# Patient Record
Sex: Male | Born: 1975 | Race: White | Hispanic: No | Marital: Married | Smoking: Current every day smoker
Health system: Southern US, Community
[De-identification: ages and names within clinical notes are randomized; demographics above are authoritative.]

## PROBLEM LIST (undated history)

## (undated) DIAGNOSIS — G43909 Migraine, unspecified, not intractable, without status migrainosus: Secondary | ICD-10-CM

## (undated) DIAGNOSIS — J302 Other seasonal allergic rhinitis: Secondary | ICD-10-CM

## (undated) DIAGNOSIS — K649 Unspecified hemorrhoids: Secondary | ICD-10-CM

## (undated) DIAGNOSIS — J9383 Other pneumothorax: Secondary | ICD-10-CM

## (undated) HISTORY — DX: Unspecified hemorrhoids: K64.9

## (undated) HISTORY — PX: WRIST FRACTURE SURGERY: SHX121

## (undated) HISTORY — PX: FRACTURE SURGERY: SHX138

---

## 2000-08-12 ENCOUNTER — Emergency Department (HOSPITAL_COMMUNITY): Admission: EM | Admit: 2000-08-12 | Discharge: 2000-08-12 | Payer: Self-pay

## 2006-12-25 ENCOUNTER — Encounter
Admission: RE | Admit: 2006-12-25 | Discharge: 2006-12-25 | Payer: Self-pay | Admitting: Physical Medicine and Rehabilitation

## 2015-07-01 ENCOUNTER — Ambulatory Visit (INDEPENDENT_AMBULATORY_CARE_PROVIDER_SITE_OTHER): Payer: No Typology Code available for payment source | Admitting: Family Medicine

## 2015-07-01 VITALS — BP 142/98 | HR 95 | Temp 98.3°F | Resp 20 | Ht 69.5 in | Wt 169.0 lb

## 2015-07-01 DIAGNOSIS — K648 Other hemorrhoids: Secondary | ICD-10-CM

## 2015-07-01 DIAGNOSIS — K644 Residual hemorrhoidal skin tags: Secondary | ICD-10-CM | POA: Diagnosis not present

## 2015-07-01 DIAGNOSIS — K625 Hemorrhage of anus and rectum: Secondary | ICD-10-CM

## 2015-07-01 LAB — HEMOCCULT GUIAC POC 1CARD (OFFICE): Fecal Occult Blood, POC: NEGATIVE

## 2015-07-01 MED ORDER — HYDROCORTISONE ACETATE 25 MG RE SUPP: RECTAL | Status: DC

## 2015-07-01 NOTE — Patient Instructions (Addendum)
IF you received an x-ray today, you will receive an invoice from [facility]. Please contact [facility] at [phone] with questions or concerns regarding your invoice.   IF you received labwork today, you will receive an invoice from [facility]. Please contact [facility] at [phone] with questions or concerns regarding your invoice.   Our billing staff will not be able to assist you with questions regarding bills from these companies.  You will be contacted with the lab results as soon as they are available. The fastest way to get your results is to activate your My Chart account. Instructions are located on the last page of this paperwork. If you have not heard from us regarding the results in 2 weeks, please contact this office.  About Hemorrhoids  Hemorrhoids are swollen veins in the lower rectum and anus.  Also called piles, hemorrhoids are a common problem.  Hemorrhoids may be internal (inside the rectum) or external (around the anus).  Internal Hemorrhoids  Internal hemorrhoids are often painless, but they rarely cause bleeding.  The internal veins may stretch and fall down (prolapse) through the anus to the outside of the body.  The veins may then become irritated and painful.  External Hemorrhoids  External hemorrhoids can be easily seen or felt around the anal opening.  They are under the skin around the anus.  When the swollen veins are scratched or broken by straining, rubbing or wiping they sometimes bleed.  How Hemorrhoids Occur  Veins in the rectum and around the anus tend to swell under pressure.  Hemorrhoids can result from increased pressure in the veins of your anus or rectum.  Some sources of pressure are:   Straining to have a bowel movement because of constipation  Waiting too long to have a bowel movement  Coughing and sneezing often  Sitting for extended periods of time, including on the  toilet  Diarrhea  Obesity  Trauma or injury to the anus  Some liver diseases  Stress  Family history of hemorrhoids  Pregnancy  Pregnant women should try to avoid becoming constipated, because they are more likely to have hemorrhoids during pregnancy.  In the last trimester of pregnancy, the enlarged uterus may press on blood vessels and causes hemorrhoids.  In addition, the strain of childbirth sometimes causes hemorrhoids after the birth.  Symptoms of Hemorrhoids  Some symptoms of hemorrhoids include:  Swelling and/or a tender lump around the anus  Itching, mild burning and bleeding around the anus  Painful bowel movements with or without constipation  Bright red blood covering the stool, on toilet paper or in the toilet bowel.   Symptoms usually go away within a few days.  Always talk to your doctor about any bleeding to make sure it is not from some other causes.  Diagnosing and Treating Hemorrhoids  Diagnosis is made by an examination by your healthcare provider.  Special test can be performed by your doctor.    Most cases of hemorrhoids can be treated with:  High-fiber diet: Eat more high-fiber foods, which help prevent constipation.  Ask for more detailed fiber information on types and sources of fiber from your healthcare provider.  Fluids: Drink plenty of water.  This helps soften bowel movements so they are easier to pass.  Sitz baths and cold packs: Sitting in lukewarm water two or three times a day for 15 minutes cleases the anal area and may relieve discomfort.  If the water is too hot, swelling around the  anus will get worse.  Placing a cloth-covered ice pack on the anus for ten minutes four times a day can also help reduce selling.  Gently pushing a prolapsed hemorrhoid back inside after the bath or ice pack can be helpful.  Medications: For mild discomfort, your healthcare provider may suggest over-the-counter pain medication or prescribe a cream or ointment  for topical use.  The cream may contain witch hazel, zinc oxide or petroleum jelly.  Medicated suppositories are also a treatment option.  Always consult your doctor before applying medications or creams.  Procedures and surgeries: There are also a number of procedures and surgeries to shrink or remove hemorrhoids in more serious cases.  Talk to your physician about these options.  You can often prevent hemorrhoids or keep them from becoming worse by maintaining a healthy lifestyle.  Eat a fiber-rich diet of fruits, vegetables and whole grains.  Also, drink plenty of water and exercise regularly.   2007, [employer] Doc.30  Hemorrhoids Hemorrhoids are swollen veins around the rectum or anus. There are two types of hemorrhoids:   Internal hemorrhoids. These occur in the veins just inside the rectum. They may poke through to the outside and become irritated and painful.  External hemorrhoids. These occur in the veins outside the anus and can be felt as a painful swelling or hard lump near the anus. CAUSES  Pregnancy.   Obesity.   Constipation or diarrhea.   Straining to have a bowel movement.   Sitting for long periods on the toilet.  Heavy lifting or other activity that caused you to strain.  Anal intercourse. SYMPTOMS   Pain.   Anal itching or irritation.   Rectal bleeding.   Fecal leakage.   Anal swelling.   One or more lumps around the anus.  DIAGNOSIS  Your caregiver may be able to diagnose hemorrhoids by visual examination. Other examinations or tests that may be performed include:   Examination of the rectal area with a gloved hand (digital rectal exam).   Examination of anal canal using a small tube (scope).   A blood test if you have lost a significant amount of blood.  A test to look inside the colon (sigmoidoscopy or colonoscopy). TREATMENT Most hemorrhoids can be treated at home. However, if symptoms do not seem to be getting  better or if you have a lot of rectal bleeding, your caregiver may perform a procedure to help make the hemorrhoids get smaller or remove them completely. Possible treatments include:   Placing a rubber band at the base of the hemorrhoid to cut off the circulation (rubber band ligation).   Injecting a chemical to shrink the hemorrhoid (sclerotherapy).   Using a tool to burn the hemorrhoid (infrared light therapy).   Surgically removing the hemorrhoid (hemorrhoidectomy).   Stapling the hemorrhoid to block blood flow to the tissue (hemorrhoid stapling).  HOME CARE INSTRUCTIONS   Eat foods with fiber, such as whole grains, beans, nuts, fruits, and vegetables. Ask your doctor about taking products with added fiber in them (fibersupplements).  Increase fluid intake. Drink enough water and fluids to keep your urine clear or pale yellow.   Exercise regularly.   Go to the bathroom when you have the urge to have a bowel movement. Do not wait.   Avoid straining to have bowel movements.   Keep the anal area dry and clean. Use wet toilet paper or moist towelettes after a bowel movement.   Medicated creams and suppositories may be used  or applied as directed.   Only take over-the-counter or prescription medicines as directed by your caregiver.   Take warm sitz baths for 15-20 minutes, 3-4 times a day to ease pain and discomfort.   Place ice packs on the hemorrhoids if they are tender and swollen. Using ice packs between sitz baths may be helpful.   Put ice in a plastic bag.   Place a towel between your skin and the bag.   Leave the ice on for 15-20 minutes, 3-4 times a day.   Do not use a donut-shaped pillow or sit on the toilet for long periods. This increases blood pooling and pain.  SEEK MEDICAL CARE IF:  You have increasing pain and swelling that is not controlled by treatment or medicine.  You have uncontrolled bleeding.  You have difficulty or you are unable  to have a bowel movement.  You have pain or inflammation outside the area of the hemorrhoids. MAKE SURE YOU:  Understand these instructions.  Will watch your condition.  Will get help right away if you are not doing well or get worse.   This information is not intended to replace advice given to you by your health care provider. Make sure you discuss any questions you have with your health care provider.   Document Released: 04/02/2000 Document Revised: 03/22/2012 Document Reviewed: 02/08/2012 Elsevier Interactive Patient Education [employer]

## 2015-07-01 NOTE — Progress Notes (Signed)
Subjective:    Patient ID: Riley Lewis, male    DOB: [birth year], 40 y.o.   MRN: [id number] By signing my name below, I, Littie Deeds, attest that this documentation has been prepared under the direction and in the presence of Norberto Sorenson, MD.  Electronically Signed: Littie Deeds, Medical Scribe. 07/01/2015. 7:08 PM.  Chief Complaint  Patient presents with  . Rectal Bleeding    Wants referral to GI    HPI HPI Comments: Riley Lewis is a 40 y.o. male who presents to the [facility] complaining of intermittent, worsening rectal bleeding with bright red blood that started 5 years ago. This is mostly noticed with bowel movements, but he has had 3 episodes of rectal bleeding not during bowel movements. The last episode was 2 days ago. These episodes resolve on their own and require him to change his underwear and shorts. The blood is noticed on the outside of the stool. He has tried Tucks pads. He does feel hemorrhoids when wiping. Patient denies constipation, diarrhea, dizziness, lightheadedness, and rectal pain or itching. His stool is contiguous. He has 1 bowel movement a day. Patient is requesting a referral to GI. He had tried to schedule an appointment with GI but was told he needed a referral.  History reviewed. No pertinent past medical history. Past Surgical History  Procedure Laterality Date  . Fracture surgery     No current outpatient prescriptions on file prior to visit.   No current facility-administered medications on file prior to visit.   Allergies  Allergen Reactions  . Oxycodone Itching   Family History  Problem Relation Age of Onset  . Hyperlipidemia Mother   . Hyperlipidemia Father   . Cancer Maternal Grandmother   . Cancer Maternal Grandfather   . Cancer Paternal Grandfather    Social History   Social History  . Marital Status: Single    Spouse Name: N/A  . Number of Children: N/A  . Years of Education: N/A   Social History Main Topics  .  Smoking status: Former Games developer  . Smokeless tobacco: Never Used  . Alcohol Use: 7.2 oz/week    12 Standard drinks or equivalent per week  . Drug Use: No  . Sexual Activity: Not Asked   Other Topics Concern  . None   Social History Narrative  . None     Review of Systems  Constitutional: Negative for fever, chills, diaphoresis, activity change and appetite change.  Gastrointestinal: Positive for blood in stool and anal bleeding. Negative for nausea, vomiting, abdominal pain, diarrhea, constipation, abdominal distention and rectal pain.  Genitourinary: Negative for dysuria, urgency, frequency, hematuria, flank pain, discharge, penile swelling, scrotal swelling, enuresis, difficulty urinating, genital sores, penile pain and testicular pain.  Skin: Negative for wound.  Neurological: Negative for dizziness and light-headedness.  Hematological: Negative for adenopathy.       Objective:  BP 142/98 mmHg  Pulse 95  Temp(Src) 98.3 F (36.8 C) (Oral)  Resp 20  Ht 5' 9.5" (1.765 m)  Wt 169 lb (76.658 kg)  BMI 24.61 kg/m2  SpO2 98%  Physical Exam  Constitutional: He is oriented to person, place, and time. He appears well-developed and well-nourished. No distress.  HENT:  Head: Normocephalic and atraumatic.  Mouth/Throat: Oropharynx is clear and moist. No oropharyngeal exudate.  Eyes: Pupils are equal, round, and reactive to light.  Neck: Neck supple.  Cardiovascular: Normal rate.   Pulmonary/Chest: Effort normal.  Genitourinary:  2 large external hemorrhoids, 1 on  right, 1 on left. The external hemorrhoid on the left has a large pedunculated skin tag attached to it. Rectal exam shows normal anal tone, no masses, but suspect 2 internal hemorrhoids. Small amount of stool seen in vault. No gross bleeding. No fissures seen. Rectoscope was not used.  Musculoskeletal: He exhibits no edema.  Neurological: He is alert and oriented to person, place, and time. No cranial nerve deficit.  Skin:  Skin is warm and dry. No rash noted.  Psychiatric: He has a normal mood and affect. His behavior is normal.  Nursing note and vitals reviewed.         Assessment & Plan:   1. Rectal bleeding   2. Hemorrhoidal skin tags   3. Internal hemorrhoids with complication     Orders Placed This Encounter  Procedures  . Ambulatory referral to Gastroenterology    Referral Priority:  Routine    Referral Type:  Consultation    Referral Reason:  Specialty Services Required    Number of Visits Requested:  1  . POCT occult blood stool    Meds ordered this encounter  Medications  . hydrocortisone (ANUSOL-HC) 25 MG suppository    Sig: Place 1 rectally after a bowel movement once to twice a day    Dispense:  12 suppository    Refill:  0    I personally performed the services described in this documentation, which was scribed in my presence. The recorded information has been reviewed and considered, and addended by me as needed.  Norberto SorensonEva Danita Proud, MD MPH

## 2015-07-02 ENCOUNTER — Telehealth: Payer: Self-pay

## 2015-07-02 MED ORDER — HYDROCORTISONE ACETATE 25 MG RE SUPP: RECTAL | Status: DC

## 2015-07-02 NOTE — Telephone Encounter (Signed)
Pt states that a rx was supposed to be called in for him after his visit yesterday but nothing is at the phamacy -pt does not remember the name of meds  Best number [phone]

## 2015-07-02 NOTE — Telephone Encounter (Signed)
I resent medication. Pt advised.

## 2015-07-03 ENCOUNTER — Encounter: Payer: Self-pay | Admitting: Internal Medicine

## 2015-08-07 ENCOUNTER — Encounter: Payer: Self-pay | Admitting: Internal Medicine

## 2015-08-07 ENCOUNTER — Ambulatory Visit (INDEPENDENT_AMBULATORY_CARE_PROVIDER_SITE_OTHER): Payer: PRIVATE HEALTH INSURANCE | Admitting: Internal Medicine

## 2015-08-07 VITALS — BP 132/90 | HR 88 | Ht 69.25 in | Wt 171.2 lb

## 2015-08-07 DIAGNOSIS — K625 Hemorrhage of anus and rectum: Secondary | ICD-10-CM

## 2015-08-07 MED ORDER — NA SULFATE-K SULFATE-MG SULF 17.5-3.13-1.6 GM/177ML PO SOLN: 1.0000 | Freq: Once | ORAL | Status: DC

## 2015-08-07 NOTE — Progress Notes (Signed)
HISTORY OF PRESENT ILLNESS:  Riley Lewis is a 40 y.o. 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 regarding rectal bleeding. The patient reports a several year history of intermittent minor rectal bleeding as manifested by pink or light red blood on the tissue. Last month on several occasions he noticed significant blood in the toilet bowl with defecation. Also some spontaneous blood per rectum. No associated rectal pain, abdominal pain, weight loss, or change in bowel habits. No family history of colon cancer. He was evaluated by [name] 07/01/2015. Rectal examination at that time revealed hemorrhoids. No other abnormalities. Hemoccult testing was negative. No CBC. He was prescribed hydrocortisone suppositories which she has been using fairly frequently. Less bleeding since  REVIEW OF SYSTEMS:  All non-GI ROS negative upon comprehensive review  Past Medical History  Diagnosis Date  . Hemorrhoids     Past Surgical History  Procedure Laterality Date  . Wrist surgery Left     fracture    Social History Riley Lewis  reports that he quit smoking about 3 years ago. His smoking use included Cigarettes. He has never used smokeless tobacco. He reports that he drinks about 7.2 oz of alcohol per week. He reports that he does not use illicit drugs.  family history includes Breast cancer in his maternal grandmother; Hyperlipidemia in his father and mother; Hypertension in his father and mother; Lung cancer in his paternal grandfather; Throat cancer in his maternal grandfather.  Allergies  Allergen Reactions  . Oxycodone Itching       PHYSICAL EXAMINATION: Vital signs: BP 132/90 mmHg  Pulse 88  Ht 5' 9.25" (1.759 m)  Wt 171 lb 4 oz (77.678 kg)  BMI 25.11 kg/m2  Constitutional: generally well-appearing, no acute distress Psychiatric: alert and oriented x3, cooperative Eyes: extraocular movements intact, anicteric, conjunctiva  pink Mouth: oral pharynx moist, no lesions Neck: supple no lymphadenopathy Cardiovascular: heart regular rate and rhythm, no murmur Lungs: clear to auscultation bilaterally Abdomen: soft, nontender, nondistended, no obvious ascites, no peritoneal signs, normal bowel sounds, no organomegaly Rectal:Per [name]. Deferred until colonoscopy Extremities: no clubbing cyanosis or lower extremity edema bilaterally Skin: no lesions on visible extremities Neuro: No focal deficits. Normal DTRs  ASSESSMENT:  #1. Rectal bleeding. Rule out significant colonic mucosal lesion   PLAN:  #1. Colonoscopy.The nature of the procedure, as well as the risks, benefits, and alternatives were carefully and thoroughly reviewed with the patient. Ample time for discussion and questions allowed. The patient understood, was satisfied, and agreed to proceed.  A copy of this consultation note has been sent to [name]

## 2015-08-07 NOTE — Patient Instructions (Signed)

## 2015-08-19 ENCOUNTER — Encounter: Payer: Self-pay | Admitting: Internal Medicine

## 2015-08-28 ENCOUNTER — Encounter: Payer: Self-pay | Admitting: Internal Medicine

## 2015-08-28 ENCOUNTER — Ambulatory Visit (AMBULATORY_SURGERY_CENTER): Payer: PRIVATE HEALTH INSURANCE | Admitting: Internal Medicine

## 2015-08-28 VITALS — BP 117/84 | HR 63 | Temp 98.4°F | Resp 10 | Ht 69.25 in | Wt 171.0 lb

## 2015-08-28 DIAGNOSIS — K625 Hemorrhage of anus and rectum: Secondary | ICD-10-CM

## 2015-08-28 MED ORDER — SODIUM CHLORIDE 0.9 % IV SOLN: 500.0000 mL | INTRAVENOUS | Status: DC

## 2015-08-28 NOTE — Op Note (Signed)
[facility] Patient Name: Riley Lewis Procedure Date: 08/28/2015 8:46 AM MRN: [id number] Endoscopist: Wilhemina Bonito. Marina Goodell , MD Age: 40 Date of Birth: 04-17-1976 Gender: Male Procedure:                Colonoscopy Indications:              Rectal bleeding Medicines:                Monitored Anesthesia Care Procedure:                Pre-Anesthesia Assessment:                           - Prior to the procedure, a History and Physical                            was performed, and patient medications and                            allergies were reviewed. The patient's tolerance of                            previous anesthesia was also reviewed. The risks                            and benefits of the procedure and the sedation                            options and risks were discussed with the patient.                            All questions were answered, and informed consent                            was obtained. Prior Anticoagulants: The patient has                            taken no previous anticoagulant or antiplatelet                            agents. ASA Grade Assessment: I - A normal, healthy                            patient. After reviewing the risks and benefits,                            the patient was deemed in satisfactory condition to                            undergo the procedure.                           After obtaining informed consent, the colonoscope                            was  passed under direct vision. Throughout the                            procedure, the patient's blood pressure, pulse, and                            oxygen saturations were monitored continuously. The                            Model CF-HQ190L [id number]) scope was introduced                            through the anus and advanced to the the cecum,                            identified by appendiceal orifice and ileocecal                            valve. The ileocecal  valve, appendiceal orifice,                            and rectum were photographed. The quality of the                            bowel preparation was excellent. The colonoscopy                            was performed without difficulty. The patient                            tolerated the procedure well. The bowel preparation                            used was SUPREP. Scope In: 8:51:43 AM Scope Out: 9:03:00 AM Scope Withdrawal Time: 0 hours 7 minutes 27 seconds  Total Procedure Duration: 0 hours 11 minutes 17 seconds  Findings:                 A few small-mouthed diverticula were found in the                            sigmoid colon.                           Internal hemorrhoids were found during                            retroflexion. The hemorrhoids were medium-sized.                            Tags present as well.                           The exam was otherwise without abnormality on  direct and retroflexion views. Complications:            No immediate complications. Estimated blood loss:                            None. Estimated Blood Loss:     Estimated blood loss: none. Impression:               - Diverticulosis in the sigmoid colon.                           - Internal hemorrhoids.                           - The examination was otherwise normal on direct                            and retroflexion views. Recommendation:           - Repeat colonoscopy in 10 years for screening                            purposes.                           - Continue hydrocortisone suppositories at night                            for 2 weeks                           - Daily fiber supplementation with Metamucil 2                            tablespoons                           - If bleeding refractory to these therapies,                            consider surgical hemorrhoidectomy. Wilhemina BonitoJohn N. Marina GoodellPerry, MD 08/28/2015 9:10:11 AM This report has been signed  electronically. CC Letter to:             Levell JulyEva N. Clelia CroftShaw

## 2015-08-28 NOTE — Progress Notes (Signed)
Report to PACU, RN, vss, BBS= Clear.  

## 2015-08-28 NOTE — Patient Instructions (Addendum)
YOU HAD AN ENDOSCOPIC PROCEDURE TODAY AT THE [facility]:   Refer to the procedure report that was given to you for any specific questions about what was found during the examination.  If the procedure report does not answer your questions, please call your gastroenterologist to clarify.  If you requested that your care partner not be given the details of your procedure findings, then the procedure report has been included in a sealed envelope for you to review at your convenience later.  YOU SHOULD EXPECT: Some feelings of bloating in the abdomen. Passage of more gas than usual.  Walking can help get rid of the air that was put into your GI tract during the procedure and reduce the bloating. If you had a lower endoscopy (such as a colonoscopy or flexible sigmoidoscopy) you may notice spotting of blood in your stool or on the toilet paper. If you underwent a bowel prep for your procedure, you may not have a normal bowel movement for a few days.  Please Note:  You might notice some irritation and congestion in your nose or some drainage.  This is from the oxygen used during your procedure.  There is no need for concern and it should clear up in a day or so.  SYMPTOMS TO REPORT IMMEDIATELY:   Following lower endoscopy (colonoscopy or flexible sigmoidoscopy):  Excessive amounts of blood in the stool  Significant tenderness or worsening of abdominal pains  Swelling of the abdomen that is new, acute  Fever of 100F or higher   For urgent or emergent issues, a gastroenterologist can be reached at any hour by calling [phone].   DIET: Your first meal following the procedure should be a small meal and then it is ok to progress to your normal diet. Heavy or fried foods are harder to digest and may make you feel nauseous or bloated.  Likewise, meals heavy in dairy and vegetables can increase bloating.  Drink plenty of fluids but you should avoid alcoholic beverages for 24 hours.  Try to  increase the fiber in your diet with Metamucil twice daily.   ACTIVITY:  You should plan to take it easy for the rest of today and you should NOT DRIVE or use heavy machinery until tomorrow (because of the sedation medicines used during the test).    FOLLOW UP: Our staff will call the number listed on your records the next business day following your procedure to check on you and address any questions or concerns that you may have regarding the information given to you following your procedure. If we do not reach you, we will leave a message.  However, if you are feeling well and you are not experiencing any problems, there is no need to return our call.  We will assume that you have returned to your regular daily activities without incident.   Continue your hydrocortisone suppositories as ordered.    SIGNATURES/CONFIDENTIALITY: You and/or your care partner have signed paperwork which will be entered into your electronic medical record.  These signatures attest to the fact that that the information above on your After Visit Summary has been reviewed and is understood.  Full responsibility of the confidentiality of this discharge information lies with you and/or your care-partner.  Thank-you for choosing us for your healthcare needs today.

## 2015-08-28 NOTE — Progress Notes (Addendum)
Patient up to bathroom at 20 min mark; no luck passing gas.  Patient is vague about any pain. Wife states that he would tell us if he were hurting.   Patient will walk the unit with wife until he passes some gas.  Patient walking hall and finally stated pain #5 abd so levsin was given.  Kept walking; refused a rectal tube.  Order obtained from Dr. Marina GoodellPerry for 30ml of simethicone po stat.  Simethicone given and patient is passing gas.  Patient still walking hall with #4 abd pain.    Discharged at 1004 with a pain of 3.5. Patient going home, but will call me with an update in 2 hours.  I suspect that he hurts more than he is willing to say, but he insists on going home. Dr. Marina GoodellPerry aware.

## 2015-08-29 ENCOUNTER — Telehealth: Payer: Self-pay

## 2015-08-29 NOTE — Telephone Encounter (Signed)
  Follow up Call-  Call back number 08/28/2015  Post procedure Call Back phone  # [phone]  Permission to leave phone message Yes     Patient questions:  Do you have a fever, pain , or abdominal swelling? No. Pain Score  0 *  Have you tolerated food without any problems? Yes.    Have you been able to return to your normal activities? Yes.    Do you have any questions about your discharge instructions: Diet   No. Medications  No. Follow up visit  No.  Do you have questions or concerns about your Care? No.  Actions: * If pain score is 4 or above: No action needed, pain <4.

## 2016-06-01 ENCOUNTER — Inpatient Hospital Stay (HOSPITAL_COMMUNITY): Payer: No Typology Code available for payment source

## 2016-06-01 ENCOUNTER — Emergency Department (HOSPITAL_COMMUNITY): Payer: No Typology Code available for payment source

## 2016-06-01 ENCOUNTER — Encounter (HOSPITAL_COMMUNITY): Payer: Self-pay | Admitting: Cardiothoracic Surgery

## 2016-06-01 ENCOUNTER — Inpatient Hospital Stay (HOSPITAL_COMMUNITY)
Admission: EM | Admit: 2016-06-01 | Discharge: 2016-06-10 | DRG: 165 | Disposition: A | Discharge status: Home / Self Care | Payer: No Typology Code available for payment source | Attending: Cardiothoracic Surgery | Admitting: Cardiothoracic Surgery

## 2016-06-01 DIAGNOSIS — J9383 Other pneumothorax: Secondary | ICD-10-CM | POA: Diagnosis present

## 2016-06-01 DIAGNOSIS — J9382 Other air leak: Secondary | ICD-10-CM | POA: Diagnosis not present

## 2016-06-01 DIAGNOSIS — F17290 Nicotine dependence, other tobacco product, uncomplicated: Secondary | ICD-10-CM | POA: Diagnosis present

## 2016-06-01 DIAGNOSIS — Z9689 Presence of other specified functional implants: Secondary | ICD-10-CM

## 2016-06-01 DIAGNOSIS — Z4682 Encounter for fitting and adjustment of non-vascular catheter: Secondary | ICD-10-CM

## 2016-06-01 DIAGNOSIS — R0602 Shortness of breath: Secondary | ICD-10-CM | POA: Diagnosis present

## 2016-06-01 DIAGNOSIS — Z09 Encounter for follow-up examination after completed treatment for conditions other than malignant neoplasm: Secondary | ICD-10-CM

## 2016-06-01 DIAGNOSIS — J939 Pneumothorax, unspecified: Secondary | ICD-10-CM | POA: Diagnosis present

## 2016-06-01 HISTORY — DX: Migraine, unspecified, not intractable, without status migrainosus: G43.909

## 2016-06-01 HISTORY — DX: Other pneumothorax: J93.83

## 2016-06-01 HISTORY — PX: CHEST TUBE INSERTION: SHX231

## 2016-06-01 HISTORY — DX: Other seasonal allergic rhinitis: J30.2

## 2016-06-01 LAB — CBC WITH DIFFERENTIAL/PLATELET
BASOS ABS: 0 10*3/uL (ref 0.0–0.1)
BASOS PCT: 0 %
Eosinophils Absolute: 0.1 10*3/uL (ref 0.0–0.7)
Eosinophils Relative: 1 %
HCT: 49.8 % (ref 39.0–52.0)
Hemoglobin: 18.1 g/dL — ABNORMAL HIGH (ref 13.0–17.0)
Lymphocytes Relative: 25 %
Lymphs Abs: 1.7 10*3/uL (ref 0.7–4.0)
MCH: 32 pg (ref 26.0–34.0)
MCHC: 36.3 g/dL — ABNORMAL HIGH (ref 30.0–36.0)
MCV: 88.1 fL (ref 78.0–100.0)
MONO ABS: 0.8 10*3/uL (ref 0.1–1.0)
Monocytes Relative: 12 %
NEUTROS ABS: 4.2 10*3/uL (ref 1.7–7.7)
Neutrophils Relative %: 62 %
PLATELETS: 181 10*3/uL (ref 150–400)
RBC: 5.65 MIL/uL (ref 4.22–5.81)
RDW: 12.1 % (ref 11.5–15.5)
WBC: 6.8 10*3/uL (ref 4.0–10.5)

## 2016-06-01 LAB — BASIC METABOLIC PANEL
ANION GAP: 11 (ref 5–15)
BUN: 11 mg/dL (ref 6–20)
CALCIUM: 9.5 mg/dL (ref 8.9–10.3)
CO2: 18 mmol/L — AB (ref 22–32)
Chloride: 108 mmol/L (ref 101–111)
Creatinine, Ser: 0.9 mg/dL (ref 0.61–1.24)
Glucose, Bld: 90 mg/dL (ref 65–99)
Potassium: 4.3 mmol/L (ref 3.5–5.1)
Sodium: 137 mmol/L (ref 135–145)

## 2016-06-01 LAB — PROTIME-INR
INR: 1
Prothrombin Time: 13.2 seconds (ref 11.4–15.2)

## 2016-06-01 LAB — APTT: APTT: 25 s (ref 24–36)

## 2016-06-01 MED ORDER — SORBITOL 70 % SOLN
30.0000 mL | Freq: Every day | Status: DC | PRN
  Filled 2016-06-01: qty 30

## 2016-06-01 MED ORDER — ACETAMINOPHEN 325 MG PO TABS: 650.0000 mg | ORAL_TABLET | Freq: Four times a day (QID) | ORAL | Status: DC | PRN

## 2016-06-01 MED ORDER — ONDANSETRON HCL 4 MG PO TABS: 4.0000 mg | ORAL_TABLET | Freq: Four times a day (QID) | ORAL | Status: DC | PRN

## 2016-06-01 MED ORDER — GUAIFENESIN-DM 100-10 MG/5ML PO SYRP: 15.0000 mL | ORAL_SOLUTION | ORAL | Status: DC | PRN

## 2016-06-01 MED ORDER — LIDOCAINE HCL (PF) 1 % IJ SOLN
INTRAMUSCULAR | Status: AC
  Administered 2016-06-01: 30 mL
  Filled 2016-06-01: qty 30

## 2016-06-01 MED ORDER — ZOLPIDEM TARTRATE 5 MG PO TABS: 5.0000 mg | ORAL_TABLET | Freq: Every evening | ORAL | Status: DC | PRN

## 2016-06-01 MED ORDER — MIDAZOLAM HCL 2 MG/2ML IJ SOLN
4.0000 mg | Freq: Once | INTRAMUSCULAR | Status: DC
  Filled 2016-06-01: qty 4

## 2016-06-01 MED ORDER — DEXTROSE-NACL 5-0.45 % IV SOLN
INTRAVENOUS | Status: DC
  Administered 2016-06-01: 14:00:00 via INTRAVENOUS

## 2016-06-01 MED ORDER — TRAMADOL HCL 50 MG PO TABS
50.0000 mg | ORAL_TABLET | Freq: Four times a day (QID) | ORAL | Status: DC | PRN
  Administered 2016-06-01 – 2016-06-02 (×3): 50 mg via ORAL
  Filled 2016-06-01 (×3): qty 1

## 2016-06-01 MED ORDER — DOCUSATE SODIUM 100 MG PO CAPS
100.0000 mg | ORAL_CAPSULE | Freq: Two times a day (BID) | ORAL | Status: DC
  Administered 2016-06-01 – 2016-06-04 (×3): 100 mg via ORAL
  Filled 2016-06-01 (×9): qty 1

## 2016-06-01 MED ORDER — ASPIRIN EC 81 MG PO TBEC
81.0000 mg | DELAYED_RELEASE_TABLET | Freq: Every day | ORAL | Status: DC
  Administered 2016-06-01 – 2016-06-10 (×9): 81 mg via ORAL
  Filled 2016-06-01 (×9): qty 1

## 2016-06-01 MED ORDER — ACETAMINOPHEN 650 MG RE SUPP: 650.0000 mg | Freq: Four times a day (QID) | RECTAL | Status: DC | PRN

## 2016-06-01 MED ORDER — ALUM & MAG HYDROXIDE-SIMETH 200-200-20 MG/5ML PO SUSP: 30.0000 mL | ORAL | Status: DC | PRN

## 2016-06-01 MED ORDER — KETOROLAC TROMETHAMINE 15 MG/ML IJ SOLN
15.0000 mg | Freq: Four times a day (QID) | INTRAMUSCULAR | Status: AC | PRN
  Administered 2016-06-01 – 2016-06-02 (×2): 15 mg via INTRAVENOUS
  Filled 2016-06-01 (×2): qty 1

## 2016-06-01 MED ORDER — MIDAZOLAM HCL 2 MG/2ML IJ SOLN
INTRAMUSCULAR | Status: AC | PRN
  Administered 2016-06-01 (×2): 1 mg via INTRAVENOUS
  Administered 2016-06-01: 2 mg via INTRAVENOUS

## 2016-06-01 MED ORDER — FENTANYL CITRATE (PF) 100 MCG/2ML IJ SOLN
INTRAMUSCULAR | Status: AC | PRN
  Administered 2016-06-01 (×2): 25 ug via INTRAVENOUS
  Administered 2016-06-01: 50 ug via INTRAVENOUS

## 2016-06-01 MED ORDER — ENOXAPARIN SODIUM 40 MG/0.4ML ~~LOC~~ SOLN
40.0000 mg | SUBCUTANEOUS | Status: DC
  Administered 2016-06-01 – 2016-06-03 (×3): 40 mg via SUBCUTANEOUS
  Filled 2016-06-01 (×5): qty 0.4

## 2016-06-01 MED ORDER — ONDANSETRON HCL 4 MG/2ML IJ SOLN
4.0000 mg | Freq: Four times a day (QID) | INTRAMUSCULAR | Status: DC | PRN
  Administered 2016-06-07: 4 mg via INTRAVENOUS

## 2016-06-01 MED ORDER — GUAIFENESIN ER 600 MG PO TB12
600.0000 mg | ORAL_TABLET | Freq: Two times a day (BID) | ORAL | Status: DC
  Administered 2016-06-01 – 2016-06-06 (×12): 600 mg via ORAL
  Filled 2016-06-01 (×12): qty 1

## 2016-06-01 MED ORDER — MAGNESIUM HYDROXIDE 400 MG/5ML PO SUSP: 30.0000 mL | Freq: Every day | ORAL | Status: DC | PRN

## 2016-06-01 MED ORDER — FENTANYL CITRATE (PF) 100 MCG/2ML IJ SOLN: 50.0000 ug | INTRAMUSCULAR | Status: DC | PRN

## 2016-06-01 MED ORDER — FENTANYL CITRATE (PF) 100 MCG/2ML IJ SOLN
100.0000 ug | Freq: Once | INTRAMUSCULAR | Status: DC
Start: 2016-06-01 — End: 2016-06-07
  Filled 2016-06-01: qty 2

## 2016-06-01 NOTE — Progress Notes (Signed)
Patient in bed, family present at bedside. No other needs at this time, call light within reach

## 2016-06-01 NOTE — Progress Notes (Signed)
  Subjective: R 80% spontaneous pneumothorax 20 F chest tube placed in ED CXR ordered Admit for chest tube management Objective: Vital signs in last 24 hours: Temp:  [98.2 F (36.8 C)] 98.2 F (36.8 C) (02/13 1121) Pulse Rate:  [79-88] 88 (02/13 1200) Cardiac Rhythm: Normal sinus rhythm (02/13 1300) Resp:  [18-20] 20 (02/13 1200) BP: (132-161)/(109-123) 132/109 (02/13 1200) SpO2:  [97 %-99 %] 99 % (02/13 1200)  Hemodynamic parameters for last 24 hours:  stable  Intake/Output from previous day: No intake/output data recorded. Intake/Output this shift: No intake/output data recorded.  decreased breath sounds on Right  Lab Results:  Recent Labs  06/01/16 1147  WBC 6.8  HGB 18.1*  HCT 49.8  PLT 181   BMET:  Recent Labs  06/01/16 1147  NA 137  K 4.3  CL 108  CO2 18*  GLUCOSE 90  BUN 11  CREATININE 0.90  CALCIUM 9.5    PT/INR:  Recent Labs  06/01/16 1147  LABPROT 13.2  INR 1.00   ABG No results found for: PHART, HCO3, TCO2, ACIDBASEDEF, O2SAT CBG (last 3)  No results for input(s): GLUCAP in the last 72 hours.  Assessment/Plan: S/P  R chest tube for pneumothorax Admit to 2 west   LOS: 0 days    Kathlee Nationseter Van Trigt III 06/01/2016

## 2016-06-01 NOTE — H&P (Signed)
[address]       [place] [address]             [phone]        Brittain Hosie [facility] Medical Record #[id number] Date of Birth: 1975/08/08  Referring: No ref. provider found Primary Care: No PCP Per Patient  Chief Complaint:    Chief Complaint  Patient presents with  . confirmed pneumo  Patient examined, chest x-ray personally reviewed  History of Present Illness:      41 year old Caucasian male without history pulmonary disease who smokes electronic cigarettes presents with 2-3 days symptoms of right chest discomfort and shortness of breath. He had some preceding coughing. He was seen at urgent care and chest x-ray demonstrated a right spontaneous pneumothorax, 80%. He was transferred to the [facility] or chest x-ray confirmed large right spontaneous pneumothorax. A 20 French chest tube was placed in the emergency department and the patient will be admitted for chest tube management.  This is the patient's first episode of spontaneous pneumothorax. No history of recent falls, or heavy lifting  Current Activity/ Functional Status: Patient is married and employed with normal functional status   Zubrod Score: At the time of surgery this patient's most appropriate activity status/level should be described as: []     0    Normal activity, no symptoms [x]     1    Restricted in physical strenuous activity but ambulatory, able to do out light work []     2    Ambulatory and capable of self care, unable to do work activities, up and about                 more than 50%  Of the time                            []     3    Only limited self care, in bed greater than 50% of waking hours []     4    Completely disabled, no self care, confined to bed or chair []     5    Moribund  Past Medical History:  Diagnosis Date  . Allergy    SEASONAL  . Hemorrhoids     Past Surgical History:  Procedure Laterality Date  . WRIST SURGERY Left    fracture    History    Smoking Status  . Former Smoker  . Types: Cigarettes  . Quit date: 08/06/2012  Smokeless Tobacco  . Never Used   History  Alcohol Use  . 7.2 oz/week  . 12 Standard drinks or equivalent per week    Social History   Social History  . Marital status: Single    Spouse name: N/A  . Number of children: 1  . Years of education: N/A   Occupational History  . [employer]    Social History Main Topics  . Smoking status: Former Smoker    Types: Cigarettes    Quit date: 08/06/2012  . Smokeless tobacco: Never Used  . Alcohol use 7.2 oz/week    12 Standard drinks or equivalent per week  . Drug use: No  . Sexual activity: Not on file   Other Topics Concern  . Not on file   Social History Narrative  . No narrative on file    Allergies  Allergen Reactions  . Oxycodone Itching    Current Facility-Administered Medications  Medication Dose  Route Frequency Provider Last Rate Last Dose  . acetaminophen (TYLENOL) tablet 650 mg  650 mg Oral Q6H PRN Kerin PernaPeter Van Trigt, MD       Or  . acetaminophen (TYLENOL) suppository 650 mg  650 mg Rectal Q6H PRN Kerin PernaPeter Van Trigt, MD      . alum & mag hydroxide-simeth (MAALOX/MYLANTA) 200-200-20 MG/5ML suspension 30 mL  30 mL Oral Q2H PRN Kerin PernaPeter Van Trigt, MD      . aspirin EC tablet 81 mg  81 mg Oral Daily Kerin PernaPeter Van Trigt, MD      . dextrose 5 %-0.45 % sodium chloride infusion   Intravenous Continuous Kerin PernaPeter Van Trigt, MD      . docusate sodium (COLACE) capsule 100 mg  100 mg Oral BID Kerin PernaPeter Van Trigt, MD      . enoxaparin (LOVENOX) injection 40 mg  40 mg Subcutaneous Q24H Kerin PernaPeter Van Trigt, MD      . fentaNYL (SUBLIMAZE) injection 100 mcg  100 mcg Intravenous Once Melene Planan Floyd, DO      . guaiFENesin-dextromethorphan (ROBITUSSIN DM) 100-10 MG/5ML syrup 15 mL  15 mL Oral Q4H PRN Kerin PernaPeter Van Trigt, MD      . ketorolac (TORADOL) 15 MG/ML injection 15 mg  15 mg Intravenous Q6H PRN Kerin PernaPeter Van Trigt, MD      . magnesium hydroxide (MILK OF MAGNESIA) suspension 30 mL   30 mL Oral Daily PRN Kerin PernaPeter Van Trigt, MD      . midazolam (VERSED) injection 4 mg  4 mg Intravenous Once Melene Planan Floyd, DO      . ondansetron [facility](ZOFRAN) tablet 4 mg  4 mg Oral Q6H PRN Kerin PernaPeter Van Trigt, MD       Or  . ondansetron [facility](ZOFRAN) injection 4 mg  4 mg Intravenous Q6H PRN Kerin PernaPeter Van Trigt, MD      . sorbitol 70 % solution 30 mL  30 mL Oral Daily PRN Kerin PernaPeter Van Trigt, MD      . traMADol Janean Sark(ULTRAM) tablet 50 mg  50 mg Oral Q6H PRN Kerin PernaPeter Van Trigt, MD      . zolpidem (AMBIEN) tablet 5 mg  5 mg Oral QHS PRN,MR X 1 Kerin PernaPeter Van Trigt, MD       Current Outpatient Prescriptions  Medication Sig Dispense Refill  . hydrocortisone (ANUSOL-HC) 25 MG suppository Place 1 rectally after a bowel movement once to twice a day (Patient taking differently: Place 1 rectally after a bowel movement as needed) 12 suppository 0     (Not in a hospital admission)  Family History  Problem Relation Age of Onset  . Hyperlipidemia Mother   . Hyperlipidemia Father   . Breast cancer Maternal Grandmother   . Throat cancer Maternal Grandfather   . Lung cancer Paternal Grandfather   . Hypertension Mother   . Hypertension Father      Review of Systems:       Cardiac Review of Systems: Y or N  Chest Pain [   Yes ]  Resting SOB [ yes ] Exertional SOB  Mahler.Beck[yes  ]  Orthopnea Mahler.Beck[yes  ]   Pedal Edema [ no  ]    Palpitations [ no ] Syncope  [ no ]   Presyncope [ no  ]  General Review of Systems: [Y] = yes [  ]=no Constitional: recent weight change [  ]; anorexia [  ]; fatigue [  ]; nausea [  ]; night sweats [  ]; fever [  ]; or chills [  ]  Dental: poor dentition[  ]; Last Dentist visit: 6 months   Eye : blurred vision [  ]; diplopia [   ]; vision changes [  ];  Amaurosis fugax[  ]; Resp: cough [ yes ];  wheezing[  ];  hemoptysis[  ]; shortness of breath[  ]; paroxysmal nocturnal dyspnea[  ]; dyspnea on exertion[ yes  ]; or orthopnea[  ];  GI:  gallstones[  ], vomiting[   ];  dysphagia[  ]; melena[  ];  hematochezia [  ]; heartburn[  ];   Hx of  Colonoscopy[  ]; GU: kidney stones [  ]; hematuria[  ];   dysuria [  ];  nocturia[  ];  history of     obstruction [  ]; urinary frequency [  ]             Skin: rash, swelling[  ];, hair loss[  ];  peripheral edema[  ];  or itching[  ]; Musculosketetal: myalgias[  ];  joint swelling[  ];  joint erythema[  ];  joint pain[  ];  back pain[  ];  Heme/Lymph: bruising[  ];  bleeding[  ];  anemia[  ];  Neuro: TIA[  ];  headaches[  ];  stroke[  ];  vertigo[  ];  seizures[  ];   paresthesias[  ];  difficulty walking[  ];  Psych:depression[  ]; anxiety[  ];  Endocrine: diabetes[  ];  thyroid dysfunction[  ];  Immunizations: Flu [  ]; Pneumococcal[  ];  Other: Right-hand dominant  Physical Exam: BP 131/100   Pulse 91   Temp 98.2 F (36.8 C) (Oral)   Resp 20   SpO2 97%        Physical Exam  General: Well-developed middle-aged Caucasian male no acute distress HEENT: Normocephalic pupils equal , dentition adequate Neck: Supple without JVD, adenopathy, or bruit Chest: Diminished breath sounds on right side, no rhonchi, no tenderness             or deformity Cardiovascular: Regular rate and rhythm, no murmur, no gallop, peripheral pulses             palpable in all extremities Abdomen:  Soft, nontender, no palpable mass or organomegaly Extremities: Warm, well-perfused, no clubbing cyanosis edema or tenderness,              no venous stasis changes of the legs Rectal/GU: Deferred Neuro: Grossly non--focal and symmetrical throughout Skin: Clean and dry without rash or ulceration   Diagnostic Studies & Laboratory data:     Recent Radiology Findings:   Dg Chest Portable 1 View  Result Date: 06/01/2016 CLINICAL DATA:  Cough and congestion. Patient with right side pneumothorax at an outside facility. EXAM: PORTABLE CHEST 1 VIEW COMPARISON:  None. FINDINGS: The patient has a large right pneumothorax estimated at 80-90%.  Left lung is expanded and clear. Heart size is normal. No midline shift. No focal bony abnormality. IMPRESSION: Large right pneumothorax. Critical Value/emergent results were called by telephone at the time of interpretation on 06/01/2016 at 11:51 am to Dr. Melene Plan , who verbally acknowledged these results. Electronically Signed   By: Drusilla Kanner M.D.   On: 06/01/2016 11:53     I have independently reviewed the above radiologic studies.  Recent Lab Findings: Lab Results  Component Value Date   WBC 6.8 06/01/2016   HGB 18.1 (H) 06/01/2016   HCT 49.8 06/01/2016   PLT 181 06/01/2016   GLUCOSE 90 06/01/2016   NA  137 06/01/2016   K 4.3 06/01/2016   CL 108 06/01/2016   CREATININE 0.90 06/01/2016   BUN 11 06/01/2016   CO2 18 (L) 06/01/2016   INR 1.00 06/01/2016      Assessment / Plan:      20 French right chest tube placed with decompression of right pneumothorax. Chest tube placed to Pleur-evac 20 cm suction Patient to be admitted to telemetry for monitoring and chest tube management of spontaneous pneumothorax

## 2016-06-01 NOTE — ED Notes (Signed)
Xray and Dr. Donata ClayVan Trigt at bedside

## 2016-06-01 NOTE — Sedation Documentation (Signed)
Wife at bedside and updated. 

## 2016-06-01 NOTE — ED Provider Notes (Signed)
[facility]-EMERGENCY DEPT Provider Note   CSN: [id number] Arrival date & time: 06/01/16  1059     History   Chief Complaint Chief Complaint  Patient presents with  . confirmed pneumo    HPI Riley Lewis is a 41 y.o. male.  41 yo M with a chief complaint of cough and congestion. He went to urgent care where he thought it likely had bronchitis. Chest x-ray performed there was concerning for right-sided pneumothorax. He has been sent to the emergency department. Patient denies chest pain shortness of breath. Denies history of prior pneumothorax. He does smoke.   The history is provided by the patient.  Illness  This is a new problem. The current episode started 2 days ago. The problem occurs constantly. The problem has not changed since onset.Pertinent negatives include no chest pain, no abdominal pain, no headaches and no shortness of breath. Nothing aggravates the symptoms. Nothing relieves the symptoms. He has tried nothing for the symptoms. The treatment provided no relief.    Past Medical History:  Diagnosis Date  . Allergy    SEASONAL  . Hemorrhoids     There are no active problems to display for this patient.   Past Surgical History:  Procedure Laterality Date  . WRIST SURGERY Left    fracture       Home Medications    Prior to Admission medications   Medication Sig Start Date End Date Taking? Authorizing Provider  hydrocortisone (ANUSOL-HC) 25 MG suppository Place 1 rectally after a bowel movement once to twice a day Patient taking differently: Place 1 rectally after a bowel movement as needed 07/02/15   Sherren Mocha, MD    Family History Family History  Problem Relation Age of Onset  . Hyperlipidemia Mother   . Hyperlipidemia Father   . Breast cancer Maternal Grandmother   . Throat cancer Maternal Grandfather   . Lung cancer Paternal Grandfather   . Hypertension Mother   . Hypertension Father     Social History Social History  Substance Use Topics  .  Smoking status: Former Smoker    Types: Cigarettes    Quit date: 08/06/2012  . Smokeless tobacco: Never Used  . Alcohol use 7.2 oz/week    12 Standard drinks or equivalent per week     Allergies   Oxycodone   Review of Systems Review of Systems  Constitutional: Negative for chills and fever.  HENT: Positive for congestion. Negative for facial swelling.   Eyes: Negative for discharge and visual disturbance.  Respiratory: Positive for cough. Negative for shortness of breath.   Cardiovascular: Negative for chest pain and palpitations.  Gastrointestinal: Negative for abdominal pain, diarrhea and vomiting.  Musculoskeletal: Negative for arthralgias and myalgias.  Skin: Negative for color change and rash.  Neurological: Negative for tremors, syncope and headaches.  Psychiatric/Behavioral: Negative for confusion and dysphoric mood.     Physical Exam Updated Vital Signs BP (!) 132/109   Pulse 88   Temp 98.2 F (36.8 C) (Oral)   Resp 20   SpO2 99%   Physical Exam  Constitutional: He is oriented to person, place, and time. He appears well-developed and well-nourished.  HENT:  Head: Normocephalic and atraumatic.  Eyes: EOM are normal. Pupils are equal, round, and reactive to light.  Neck: Normal range of motion. Neck supple. No JVD present.  Cardiovascular: Normal rate and regular rhythm.  Exam reveals no gallop and no friction rub.   No murmur heard. Pulmonary/Chest: No respiratory distress. He has no  wheezes.  Abdominal: He exhibits no distension and no mass. There is no tenderness. There is no rebound and no guarding.  Musculoskeletal: Normal range of motion.  Neurological: He is alert and oriented to person, place, and time.  Skin: No rash noted. No pallor.  Psychiatric: He has a normal mood and affect. His behavior is normal.  Nursing note and vitals reviewed.    ED Treatments / Results  Labs (all labs ordered are listed, but only abnormal results are  displayed) Labs Reviewed  CBC WITH DIFFERENTIAL/PLATELET - Abnormal; Notable for the following:       Result Value   Hemoglobin 18.1 (*)    MCHC 36.3 (*)    All other components within normal limits  BASIC METABOLIC PANEL - Abnormal; Notable for the following:    CO2 18 (*)    All other components within normal limits  PROTIME-INR  APTT    EKG  EKG Interpretation None       Radiology Dg Chest Portable 1 View  Result Date: 06/01/2016 CLINICAL DATA:  Cough and congestion. Patient with right side pneumothorax at an outside facility. EXAM: PORTABLE CHEST 1 VIEW COMPARISON:  None. FINDINGS: The patient has a large right pneumothorax estimated at 80-90%. Left lung is expanded and clear. Heart size is normal. No midline shift. No focal bony abnormality. IMPRESSION: Large right pneumothorax. Critical Value/emergent results were called by telephone at the time of interpretation on 06/01/2016 at 11:51 am to Dr. Melene PlanAN Ireanna Finlayson , who verbally acknowledged these results. Electronically Signed   By: Drusilla Kannerhomas  Dalessio M.D.   On: 06/01/2016 11:53    Procedures Procedures (including critical care time)  Medications Ordered in ED Medications  midazolam (VERSED) injection 4 mg (not administered)  fentaNYL (SUBLIMAZE) injection 100 mcg (not administered)  lidocaine (PF) (XYLOCAINE) 1 % injection (not administered)     Initial Impression / Assessment and Plan / ED Course  I have reviewed the triage vital signs and the nursing notes.  Pertinent labs & imaging results that were available during my care of the patient were reviewed by me and considered in my medical decision making (see chart for details).     41 yo M With a chief complaint of spontaneous pneumothorax. I discussed the case with CT surgery who placed a chest tube and admit.  The patients results and plan were reviewed and discussed.   Any x-rays performed were independently reviewed by myself.   Differential diagnosis were  considered with the presenting HPI.  Medications  midazolam (VERSED) injection 4 mg (not administered)  fentaNYL (SUBLIMAZE) injection 100 mcg (not administered)  lidocaine (PF) (XYLOCAINE) 1 % injection (not administered)    Vitals:   06/01/16 1121 06/01/16 1200  BP: (!) 161/123 (!) 132/109  Pulse: 79 88  Resp: 18 20  Temp: 98.2 F (36.8 C)   TempSrc: Oral   SpO2: 97% 99%    Final diagnoses:  Spontaneous pneumothorax    Admission/ observation were discussed with the admitting physician, patient and/or family and they are comfortable with the plan.    Final Clinical Impressions(s) / ED Diagnoses   Final diagnoses:  Spontaneous pneumothorax    New Prescriptions New Prescriptions   No medications on file     Melene PlanDan Taquisha Phung, DO 06/01/16 1249

## 2016-06-01 NOTE — ED Triage Notes (Signed)
Patient here from [facility] following xray for cough and congestion. Sent to ED for right sided pneumothorax. No chest pain, no shortness of breath

## 2016-06-01 NOTE — ED Notes (Signed)
Xray aware of need for portable stat CXR

## 2016-06-02 ENCOUNTER — Inpatient Hospital Stay (HOSPITAL_COMMUNITY): Payer: No Typology Code available for payment source

## 2016-06-02 LAB — CBC
HCT: 46.8 % (ref 39.0–52.0)
Hemoglobin: 15.8 g/dL (ref 13.0–17.0)
MCH: 30.6 pg (ref 26.0–34.0)
MCHC: 33.8 g/dL (ref 30.0–36.0)
MCV: 90.7 fL (ref 78.0–100.0)
Platelets: 181 10*3/uL (ref 150–400)
RBC: 5.16 MIL/uL (ref 4.22–5.81)
RDW: 12.3 % (ref 11.5–15.5)
WBC: 7.1 10*3/uL (ref 4.0–10.5)

## 2016-06-02 LAB — BASIC METABOLIC PANEL
Anion gap: 7 (ref 5–15)
BUN: 12 mg/dL (ref 6–20)
CO2: 26 mmol/L (ref 22–32)
Calcium: 9 mg/dL (ref 8.9–10.3)
Chloride: 105 mmol/L (ref 101–111)
Creatinine, Ser: 1.32 mg/dL — ABNORMAL HIGH (ref 0.61–1.24)
GFR calc Af Amer: >60 mL/min (ref >60)
GFR calc non Af Amer: >60 mL/min (ref >60)
Glucose, Bld: 101 mg/dL — ABNORMAL HIGH (ref 65–99)
Potassium: 4.5 mmol/L (ref 3.5–5.1)
Sodium: 138 mmol/L (ref 135–145)

## 2016-06-02 NOTE — Discharge Summary (Signed)
Physician Discharge Summary  Patient ID: Riley Lewis MRN: [id number] DOB/AGE: 05/21/1975 41 y.o.  Admit date: 06/01/2016 Discharge date: 06/10/2016  Admission Diagnoses:  Patient Active Problem List   Diagnosis Date Noted  . Pneumothorax on right 06/01/2016   Discharge Diagnoses:   Patient Active Problem List   Diagnosis Date Noted  . Pneumothorax on right 06/01/2016   Discharged Condition: good  History of Present Illness:  Mr. Mayeda is a 41 yo White male with no known history of COPD.  He has a positive smoking history, but has been using an E-cigarette for the past 3 years.  He presented to Urgent care with complaints of right sided chest discomfort and shortness of breath.  CXR was obtained and showed an 80% spontaneous pneumothorax.  Due to this he was transferred to [facility] ED where repeat CXR confirmed the presence of a large right spontaneous pneumothorax.  He had a 20 Fr chest tube was placed in the ED and he was admitted by Dr. Donata Clay for chest tube management.  Hospital Course:   During his admission the patient progressed well.  His follow up CXR showed no pneumothorax.  His chest tube was free from air leak and was placed to water seal on HD #1.  Repeat CXR demonstrated a lateral pneumothorax on the right side.  There was also an air leak from his chest tube.  It was decided he would benefit from VATS procedure.  The risks and benefits of the procedure were explained to the patient and he was agreeable to proceed.  He was taken to the operating room on 06/07/2016.  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  He also had a mechanical pleurodesis.  He tolerated the procedure without difficulty, was extubated and taken to the SICU in stable condition.  The patient continued to make progress.  His CXR showed stable appearance of pneumothorax.  There was no air leak and his chest tube was placed on water seal POD #1. Follow up CXR  showed a small pneumothorax <5%.  His chest tube remained on water seal.  Repeat CXR showed stable appearance of pneumothorax.  His chest tube was removed on POD #3.  Follow up CXR showed resolution of pneumothorax.  He was medically stable for discharge home.  Treatments: Chest Tube placement  Disposition: Home  Discharge Medications:   Allergies as of 06/10/2016      Reactions   Oxycodone Itching      Medication List    TAKE these medications   acetaminophen 500 MG tablet Commonly known as:  TYLENOL Take 2 tablets (1,000 mg total) by mouth every 6 (six) hours as needed for mild pain.   DAYQUIL PO Take 30 mLs by mouth daily as needed. For cold symptoms   guaiFENesin 600 MG 12 hr tablet Commonly known as:  MUCINEX Take 1 tablet (600 mg total) by mouth 2 (two) times daily as needed for cough.   hydrocortisone 25 MG suppository Commonly known as:  ANUSOL-HC Place 1 rectally after a bowel movement once to twice a day   oxyCODONE-acetaminophen 5-325 MG tablet Commonly known as:  PERCOCET/ROXICET Take 1-2 tablets by mouth every 4 (four) hours as needed for severe pain.      Follow-up Information    [facility] [place] Follow up on 06/14/2016.   Specialty:  Cardiothoracic Surgery Why:  Appointment is at 2:00, please get CXR at 1:30 at [facility] located  on first floor of our office building Contact information: [address], [address] [place] [id number] [phone]          Signed: Lowella DandyBARRETT, Kallin Henk 06/10/2016, 7:41 AM

## 2016-06-02 NOTE — Progress Notes (Addendum)
      [address]       [place] [id number]             [phone]      Subjective:  [name] is doing okay.  He does have some pain at chest tube site.   Objective: Vital signs in last 24 hours: Temp:  [97.5 F (36.4 C)-98.3 F (36.8 C)] 97.5 F (36.4 C) (02/14 0546) Pulse Rate:  [60-95] 64 (02/14 0546) Cardiac Rhythm: Normal sinus rhythm (02/14 0700) Resp:  [17-24] 18 (02/14 0546) BP: (106-161)/(77-123) 115/93 (02/14 0546) SpO2:  [96 %-100 %] 96 % (02/14 0546)  Intake/Output from previous day: 02/13 0701 - 02/14 0700 In: 627.8 [P.O.:600; I.V.:27.8] Out: 30 [Chest Tube:30]  General appearance: alert, cooperative and no distress Heart: regular rate and rhythm Lungs: clear to auscultation bilaterally Abdomen: soft, non-tender; bowel sounds normal; no masses,  no organomegaly Wound: clean and dry  Lab Results:  Recent Labs  06/01/16 1147 06/02/16 0313  WBC 6.8 7.1  HGB 18.1* 15.8  HCT 49.8 46.8  PLT 181 181   BMET:  Recent Labs  06/01/16 1147 06/02/16 0313  NA 137 138  K 4.3 4.5  CL 108 105  CO2 18* 26  GLUCOSE 90 101*  BUN 11 12  CREATININE 0.90 1.32*  CALCIUM 9.5 9.0    PT/INR:  Recent Labs  06/01/16 1147  LABPROT 13.2  INR 1.00   ABG No results found for: PHART, HCO3, TCO2, ACIDBASEDEF, O2SAT CBG (last 3)  No results for input(s): GLUCAP in the last 72 hours.  Assessment/Plan:  1. Spontaneous pneumothorax- no air leak, minimal output, CXR showed no pneumothorax 2. Pain control- allergic to Oxy, gave him hives, but has used Percocet without difficulty. 3. CV- hemodynamically stable 4. Dispo- patient stable, leave chest tube on suction today, repeat CXR in AM   LOS: 1 day    [name], [name] 06/02/2016  Redo suction to 10 cm water Portable chest x-ray in a.m.

## 2016-06-02 NOTE — Op Note (Signed)
NAME:  Riley Lewis, Riley Lewis                      ACCOUNT NO.:  MEDICAL RECORD NO.:  [id number]  LOCATION:                                 FACILITY:  PHYSICIAN:  Kerin PernaPeter Van Trigt, M.D.       DATE OF BIRTH:  DATE OF PROCEDURE:  06/01/2016 DATE OF DISCHARGE:                              OPERATIVE REPORT   OPERATION:  Placement of right chest tube for pneumothorax.  PREOPERATIVE DIAGNOSIS:  80% spontaneous right pneumothorax.  POSTOPERATIVE DIAGNOSIS:  80% spontaneous right pneumothorax.  SURGEON:  Kerin PernaPeter Van Trigt, M.D.  ANESTHESIA:  Local with 1% lidocaine with IV conscious sedation.  CLINICAL NOTE:  The patient is a 41 year old Caucasian male, smoker of electronic cigarettes, who presents with right-sided chest pain, shortness of breath and a large right spontaneous pneumothorax documented on x-ray.  Right chest tube placement was recommended. Informed consent was obtained.  OPERATIVE PROCEDURE:  The right chest was prepped and draped as a sterile field.  A proper time-out was performed.  A 1% lidocaine was infiltrated in the fifth interspace in the anterior axillary line.  A small 1-inch incision was made.  Further lidocaine was infiltrated into the chest wall and intercostal muscle.  Using a hemostat, the right pleural space was entered and immediate exit of air was noted.  A 20- French chest tube was placed into the pleural space and directed to the apex, connected to Pleur-evac chamber, and then secured to the skin with heavy silk suture.  Sterile dressing was applied.  A chest x-ray is pending.     Kerin PernaPeter Van Trigt, M.D.     PV/MEDQ  D:  06/01/2016  T:  06/02/2016  Job:  [id number]

## 2016-06-03 ENCOUNTER — Inpatient Hospital Stay (HOSPITAL_COMMUNITY): Payer: No Typology Code available for payment source

## 2016-06-03 NOTE — Progress Notes (Addendum)
      [address]       Riley Lewis,Riley Lewis             [phone]      Subjective:  [name] has no new complaints.  + ambulation  Objective: Vital signs in last 24 hours: Temp:  [97.5 F (36.4 C)-98.2 F (36.8 C)] 97.8 F (36.6 C) (02/15 0451) Pulse Rate:  [61-73] 61 (02/15 0451) Cardiac Rhythm: Sinus bradycardia (02/15 0700) Resp:  [18] 18 (02/15 0451) BP: (112-118)/(72-88) 115/78 (02/15 0451) SpO2:  [95 %-97 %] 95 % (02/15 0451) Weight:  [170 lb 6.7 oz (77.3 kg)] 170 lb 6.7 oz (77.3 kg) (02/15 0451)  Intake/Output from previous day: 02/14 0701 - 02/15 0700 In: 702 [P.O.:702] Out: 20 [Chest Tube:20]  General appearance: alert, cooperative and no distress Heart: regular rate and rhythm Lungs: clear to auscultation bilaterally Abdomen: soft, non-tender; bowel sounds normal; no masses,  no organomegaly Wound: clean and dry  Lab Results:  Recent Labs  06/01/16 1147 06/02/16 0313  WBC 6.8 7.1  HGB 18.1* 15.8  HCT 49.8 46.8  PLT 181 181   BMET:  Recent Labs  06/01/16 1147 06/02/16 0313  NA 137 138  K 4.3 4.5  CL 108 105  CO2 18* 26  GLUCOSE 90 101*  BUN 11 12  CREATININE 0.90 1.32*  CALCIUM 9.5 9.0    PT/INR:  Recent Labs  06/01/16 1147  LABPROT 13.2  INR 1.00   ABG No results found for: PHART, HCO3, TCO2, ACIDBASEDEF, O2SAT CBG (last 3)  No results for input(s): GLUCAP in the last 72 hours.  Assessment/Plan:  1. Spontaneous Pneumothorax- chest tube placed to water seal last night... Now with 4+ air leak with cough... CXR with pneumothorax along lateral wall 2. Pain control- tolerating percocet without difficulty 3. CV- hemodynamically stable 4. Dispo- patient stable, now with air leak and pneumothorax after transition to water seal... Will discuss further management with Dr. Donata ClayVan Lewis   LOS: 2 days    Riley Lewis, Riley Lewis  Continue chest tube water seal Possibility of patient needing VATS was discussed with  patient and family patient examined and medical record reviewed,agree with above note. Kathlee Nationseter Riley Lewis Lewis

## 2016-06-04 ENCOUNTER — Inpatient Hospital Stay (HOSPITAL_COMMUNITY): Payer: No Typology Code available for payment source

## 2016-06-04 NOTE — Progress Notes (Addendum)
      [address]       [place] [id number]             [phone]      Procedure(s) (LRB): VIDEO ASSISTED THORACOSCOPY (Right) STAPLING OF BLEBS (Right) TALC PLEURADESIS (Right)  Subjective:   States doing okay.  He states he can feel air around his chest tube site when he is moving around.   Objective: Vital signs in last 24 hours: Temp:  [97.5 F (36.4 C)-98 F (36.7 C)] 98 F (36.7 C) (02/16 0534) Pulse Rate:  [61-71] 63 (02/16 0534) Cardiac Rhythm: Normal sinus rhythm (02/15 1900) Resp:  [16-20] 16 (02/16 0534) BP: (127)/(72-77) 127/72 (02/16 0534) SpO2:  [98 %-99 %] 98 % (02/16 0534)  Intake/Output from previous day: 02/15 0701 - 02/16 0700 In: 960 [P.O.:960] Out: 6 [Chest Tube:6]  General appearance: alert, cooperative and no distress Heart: regular rate and rhythm Lungs: clear to auscultation bilaterally Abdomen: soft, non-tender; bowel sounds normal; no masses,  no organomegaly Wound: clean and dry  Lab Results:  Recent Labs  06/01/16 1147 06/02/16 0313  WBC 6.8 7.1  HGB 18.1* 15.8  HCT 49.8 46.8  PLT 181 181   BMET:  Recent Labs  06/01/16 1147 06/02/16 0313  NA 137 138  K 4.3 4.5  CL 108 105  CO2 18* 26  GLUCOSE 90 101*  BUN 11 12  CREATININE 0.90 1.32*  CALCIUM 9.5 9.0    PT/INR:  Recent Labs  06/01/16 1147  LABPROT 13.2  INR 1.00   ABG No results found for: PHART, HCO3, TCO2, ACIDBASEDEF, O2SAT CBG (last 3)  No results for input(s): GLUCAP in the last 72 hours.  Assessment/Plan: S/P Procedure(s) (LRB): VIDEO ASSISTED THORACOSCOPY (Right) STAPLING OF BLEBS (Right) TALC PLEURADESIS (Right)  1. Spontaneous Pneumothorax- initially air leak with cough, however with patient mentioning air around chest tube.. I replaced dressing, no further air leak on cough--- will leave chest tube to water seal today 2. Pulm- CXR looks okay, no obvious pneumothorax 3. CV- hemodynamically stable 4. Dispo- will leave  patient's chest tube to water seal today, no air leak after new dressing placed.... If CXR is stable in AM and no air leak is present, may be able to remove chest tube    LOS: 3 days    Lowella DandyBARRETT, [name] 06/04/2016 Agree with plan for chest tube as outlined by E B patient examined and medical record reviewed,agree with above note. Kathlee Nationseter Van Trigt III 06/04/2016

## 2016-06-05 ENCOUNTER — Inpatient Hospital Stay (HOSPITAL_COMMUNITY): Payer: No Typology Code available for payment source

## 2016-06-05 NOTE — Progress Notes (Addendum)
[address]       [place] [id number]             [phone]        Procedure(s) (LRB): VIDEO ASSISTED THORACOSCOPY (Right) STAPLING OF BLEBS (Right) TALC PLEURADESIS (Right) Subjective: Feels ok, not SOB  Objective: Vital signs in last 24 hours: Temp:  [98 F (36.7 C)-98.6 F (37 C)] 98.5 F (36.9 C) (02/17 0443) Pulse Rate:  [65-79] 77 (02/17 0443) Cardiac Rhythm: Normal sinus rhythm (02/17 0700) Resp:  [16-20] 16 (02/17 0443) BP: (131-148)/(83-95) 131/83 (02/17 0443) SpO2:  [96 %-99 %] 96 % (02/17 0443)  Hemodynamic parameters for last 24 hours:    Intake/Output from previous day: 02/16 0701 - 02/17 0700 In: 840 [P.O.:840] Out: 10 [Chest Tube:10] Intake/Output this shift: No intake/output data recorded.  General appearance: alert, cooperative and no distress Heart: regular rate and rhythm Lungs: clear to auscultation bilaterally Abdomen: benign Extremities: no edema Wound: ct dressing CDI  Lab Results: No results for input(s): WBC, HGB, HCT, PLT in the last 72 hours. BMET: No results for input(s): NA, K, CL, CO2, GLUCOSE, BUN, CREATININE, CALCIUM in the last 72 hours.  PT/INR: No results for input(s): LABPROT, INR in the last 72 hours. ABG No results found for: PHART, HCO3, TCO2, ACIDBASEDEF, O2SAT CBG (last 3)  No results for input(s): GLUCAP in the last 72 hours.  Meds Scheduled Meds: . aspirin EC  81 mg Oral Daily  . docusate sodium  100 mg Oral BID  . enoxaparin (LOVENOX) injection  40 mg Subcutaneous Q24H  . fentaNYL (SUBLIMAZE) injection  100 mcg Intravenous Once  . guaiFENesin  600 mg Oral BID  . midazolam  4 mg Intravenous Once   Continuous Infusions: . dextrose 5 % and 0.45% NaCl 10 mL/hr at 06/01/16 1413   PRN Meds:.acetaminophen **OR** acetaminophen, alum & mag hydroxide-simeth, fentaNYL (SUBLIMAZE) injection, guaiFENesin-dextromethorphan, ketorolac, magnesium hydroxide, ondansetron **OR** ondansetron (ZOFRAN) IV,  sorbitol, traMADol, zolpidem  Xrays Dg Chest Port 1 View  Result Date: 06/05/2016 CLINICAL DATA:  Follow-up examination for pneumothorax. EXAM: PORTABLE CHEST 1 VIEW COMPARISON:  Prior radiograph from 06/04/2016. FINDINGS: Cardiac and mediastinal silhouettes are stable. Right-sided chest tube remains in place with tip overlying the right upper lobe. A tiny right apical pneumothorax persists, similar to previous. Bibasilar atelectatic changes. No pulmonary edema or pleural effusion. No other new focal airspace disease. No acute osseous abnormality. IMPRESSION: 1. Persistent tiny right apical pneumothorax, similar to previous. 2. Bibasilar atelectasis. Electronically Signed   By: Rise MuBenjamin  McClintock M.D.   On: 06/05/2016 06:37   Dg Chest Port 1 View  Result Date: 06/04/2016 CLINICAL DATA:  Follow-up pneumothorax EXAM: PORTABLE CHEST 1 VIEW COMPARISON:  06/03/2016 FINDINGS: Right-sided chest tube remains in place. A minimal right basilar pneumothorax is seen although improved from the prior exam the apical component is less well visualized. Bibasilar atelectasis is again seen and stable slightly worse on the right than the left. Cardiac shadow is stable. No bony abnormality is seen. IMPRESSION: Improving right pneumothorax Bibasilar atelectatic changes Electronically Signed   By: Alcide CleverMark  Lukens M.D.   On: 06/04/2016 07:59    Assessment/Plan: S/P Procedure(s) (LRB): VIDEO ASSISTED THORACOSCOPY (Right) STAPLING OF BLEBS (Right) TALC PLEURADESIS (Right)   1 small air leak with cough, tiny apical pntx- keep CT for now- currently on H2O seal   LOS: 4 days    GOLD,WAYNE E 06/05/2016   Chart reviewed, patient examined, agree with above. There  is a small air leak with cough. If this is still present tomorrow will get chest CT and plan VATS on Monday per PVT.

## 2016-06-06 ENCOUNTER — Encounter (HOSPITAL_COMMUNITY): Payer: Self-pay | Admitting: Radiology

## 2016-06-06 ENCOUNTER — Inpatient Hospital Stay (HOSPITAL_COMMUNITY): Payer: No Typology Code available for payment source

## 2016-06-06 LAB — CBC
HCT: 46.5 % (ref 39.0–52.0)
Hemoglobin: 16.1 g/dL (ref 13.0–17.0)
MCH: 31 pg (ref 26.0–34.0)
MCHC: 34.6 g/dL (ref 30.0–36.0)
MCV: 89.6 fL (ref 78.0–100.0)
Platelets: 188 10*3/uL (ref 150–400)
RBC: 5.19 MIL/uL (ref 4.22–5.81)
RDW: 12.2 % (ref 11.5–15.5)
WBC: 7.9 10*3/uL (ref 4.0–10.5)

## 2016-06-06 LAB — BLOOD GAS, ARTERIAL
Acid-base deficit: 2.6 mmol/L — ABNORMAL HIGH (ref 0.0–2.0)
Bicarbonate: 21.5 mmol/L (ref 20.0–28.0)
Drawn by: 28098
FIO2: 21
O2 Saturation: 97 %
Patient temperature: 98.6
pCO2 arterial: 35.9 mmHg (ref 32.0–48.0)
pH, Arterial: 7.394 (ref 7.350–7.450)
pO2, Arterial: 93.7 mmHg (ref 83.0–108.0)

## 2016-06-06 LAB — COMPREHENSIVE METABOLIC PANEL
ALT: 32 U/L (ref 17–63)
AST: 21 U/L (ref 15–41)
Albumin: 3.7 g/dL (ref 3.5–5.0)
Alkaline Phosphatase: 47 U/L (ref 38–126)
Anion gap: 7 (ref 5–15)
BUN: 9 mg/dL (ref 6–20)
CO2: 26 mmol/L (ref 22–32)
Calcium: 9.2 mg/dL (ref 8.9–10.3)
Chloride: 106 mmol/L (ref 101–111)
Creatinine, Ser: 1.12 mg/dL (ref 0.61–1.24)
GFR calc Af Amer: >60 mL/min (ref >60)
GFR calc non Af Amer: >60 mL/min (ref >60)
Glucose, Bld: 62 mg/dL — ABNORMAL LOW (ref 65–99)
Potassium: 3.9 mmol/L (ref 3.5–5.1)
Sodium: 139 mmol/L (ref 135–145)
Total Bilirubin: 0.8 mg/dL (ref 0.3–1.2)
Total Protein: 6.6 g/dL (ref 6.5–8.1)

## 2016-06-06 LAB — TYPE AND SCREEN
ABO/RH(D): A POS
Antibody Screen: NEGATIVE

## 2016-06-06 LAB — URINALYSIS, ROUTINE W REFLEX MICROSCOPIC
Bilirubin Urine: NEGATIVE
Glucose, UA: NEGATIVE mg/dL
Hgb urine dipstick: NEGATIVE
Ketones, ur: NEGATIVE mg/dL
Leukocytes, UA: NEGATIVE
Nitrite: NEGATIVE
Protein, ur: NEGATIVE mg/dL
Specific Gravity, Urine: 1.01 (ref 1.005–1.030)
pH: 5 (ref 5.0–8.0)

## 2016-06-06 LAB — ABO/RH: ABO/RH(D): A POS

## 2016-06-06 LAB — PROTIME-INR
INR: 1.02
Prothrombin Time: 13.4 seconds (ref 11.4–15.2)

## 2016-06-06 LAB — APTT: aPTT: 33 seconds (ref 24–36)

## 2016-06-06 MED ORDER — DEXTROSE 5 % IV SOLN: 1.5000 g | INTRAVENOUS | Status: DC

## 2016-06-06 MED ORDER — IOPAMIDOL (ISOVUE-300) INJECTION 61%
INTRAVENOUS | Status: AC
  Filled 2016-06-06: qty 75

## 2016-06-06 NOTE — Progress Notes (Addendum)
      [address]       [place] [id number]             [phone]        Procedure(s) (LRB): VIDEO ASSISTED THORACOSCOPY (Right) STAPLING OF BLEBS (Right) TALC PLEURADESIS (Right) Subjective: Small air leak, appears intermittent  Objective: Vital signs in last 24 hours: Temp:  [97.5 F (36.4 C)-98.7 F (37.1 C)] 97.5 F (36.4 C) (02/18 0625) Pulse Rate:  [58-78] 60 (02/18 0625) Cardiac Rhythm: Normal sinus rhythm (02/18 0700) Resp:  [18] 18 (02/18 0625) BP: (104-118)/(69-86) 116/86 (02/18 0625) SpO2:  [96 %-99 %] 99 % (02/18 0625)  Hemodynamic parameters for last 24 hours:    Intake/Output from previous day: 02/17 0701 - 02/18 0700 In: 360 [P.O.:360] Out: 2 [Chest Tube:2] Intake/Output this shift: No intake/output data recorded.  General appearance: alert, cooperative and no distress Heart: regular rate and rhythm Lungs: clear to auscultation bilaterally  Lab Results: No results for input(s): WBC, HGB, HCT, PLT in the last 72 hours. BMET: No results for input(s): NA, K, CL, CO2, GLUCOSE, BUN, CREATININE, CALCIUM in the last 72 hours.  PT/INR: No results for input(s): LABPROT, INR in the last 72 hours. ABG No results found for: PHART, HCO3, TCO2, ACIDBASEDEF, O2SAT CBG (last 3)  No results for input(s): GLUCAP in the last 72 hours.  Meds Scheduled Meds: . aspirin EC  81 mg Oral Daily  . docusate sodium  100 mg Oral BID  . enoxaparin (LOVENOX) injection  40 mg Subcutaneous Q24H  . fentaNYL (SUBLIMAZE) injection  100 mcg Intravenous Once  . guaiFENesin  600 mg Oral BID  . midazolam  4 mg Intravenous Once   Continuous Infusions: . dextrose 5 % and 0.45% NaCl 10 mL/hr at 06/01/16 1413   PRN Meds:.acetaminophen **OR** acetaminophen, alum & mag hydroxide-simeth, fentaNYL (SUBLIMAZE) injection, guaiFENesin-dextromethorphan, ketorolac, magnesium hydroxide, ondansetron **OR** ondansetron (ZOFRAN) IV, sorbitol, traMADol, zolpidem  Xrays Dg  Chest Port 1 View  Result Date: 06/05/2016 CLINICAL DATA:  Follow-up examination for pneumothorax. EXAM: PORTABLE CHEST 1 VIEW COMPARISON:  Prior radiograph from 06/04/2016. FINDINGS: Cardiac and mediastinal silhouettes are stable. Right-sided chest tube remains in place with tip overlying the right upper lobe. A tiny right apical pneumothorax persists, similar to previous. Bibasilar atelectatic changes. No pulmonary edema or pleural effusion. No other new focal airspace disease. No acute osseous abnormality. IMPRESSION: 1. Persistent tiny right apical pneumothorax, similar to previous. 2. Bibasilar atelectasis. Electronically Signed   By: Rise MuBenjamin  McClintock M.D.   On: 06/05/2016 06:37    Assessment/Plan: S/P placement of right chest tube Small air leak- on H2O seal Will order CT scan of chest NPO after midnight in case needs VATS   LOS: 5 days    GOLD,WAYNE E 06/06/2016  Chart reviewed, patient examined, agree with above. He still has a small intermittent air leak which I have seen several times while watching the bubble chamber. Plan CT chest today and possible VATS in the am per PVT.

## 2016-06-07 ENCOUNTER — Inpatient Hospital Stay (HOSPITAL_COMMUNITY): Payer: No Typology Code available for payment source

## 2016-06-07 ENCOUNTER — Inpatient Hospital Stay (HOSPITAL_COMMUNITY): Payer: No Typology Code available for payment source | Admitting: Anesthesiology

## 2016-06-07 ENCOUNTER — Encounter (HOSPITAL_COMMUNITY)
Admission: EM | Disposition: A | Discharge status: Home / Self Care | Payer: Self-pay | Source: Home / Self Care | Attending: Cardiothoracic Surgery

## 2016-06-07 ENCOUNTER — Encounter (HOSPITAL_COMMUNITY): Payer: Self-pay | Admitting: Certified Registered Nurse Anesthetist

## 2016-06-07 HISTORY — PX: STAPLING OF BLEBS: SHX6429

## 2016-06-07 HISTORY — PX: VIDEO ASSISTED THORACOSCOPY: SHX5073

## 2016-06-07 LAB — SURGICAL PCR SCREEN
MRSA, PCR: NEGATIVE
Staphylococcus aureus: NEGATIVE

## 2016-06-07 SURGERY — VIDEO ASSISTED THORACOSCOPY
Anesthesia: General | Site: Chest | Laterality: Right

## 2016-06-07 MED ORDER — POTASSIUM CHLORIDE 2 MEQ/ML IV SOLN
30.0000 meq | Freq: Every day | INTRAVENOUS | Status: DC | PRN
  Filled 2016-06-07: qty 15

## 2016-06-07 MED ORDER — SUGAMMADEX SODIUM 200 MG/2ML IV SOLN
INTRAVENOUS | Status: AC
  Filled 2016-06-07: qty 2

## 2016-06-07 MED ORDER — ACETAMINOPHEN 160 MG/5ML PO SOLN: 1000.0000 mg | Freq: Four times a day (QID) | ORAL | Status: DC

## 2016-06-07 MED ORDER — ACETAMINOPHEN 500 MG PO TABS
1000.0000 mg | ORAL_TABLET | Freq: Four times a day (QID) | ORAL | Status: DC
  Administered 2016-06-07 – 2016-06-10 (×10): 1000 mg via ORAL
  Filled 2016-06-07 (×10): qty 2

## 2016-06-07 MED ORDER — ENOXAPARIN SODIUM 40 MG/0.4ML ~~LOC~~ SOLN
40.0000 mg | SUBCUTANEOUS | Status: DC
  Administered 2016-06-08 – 2016-06-09 (×2): 40 mg via SUBCUTANEOUS
  Filled 2016-06-07 (×2): qty 0.4

## 2016-06-07 MED ORDER — KETOROLAC TROMETHAMINE 30 MG/ML IJ SOLN
15.0000 mg | Freq: Four times a day (QID) | INTRAMUSCULAR | Status: DC | PRN
  Administered 2016-06-08: 15 mg via INTRAVENOUS
  Filled 2016-06-07: qty 1

## 2016-06-07 MED ORDER — PROMETHAZINE HCL 25 MG/ML IJ SOLN: 6.2500 mg | INTRAMUSCULAR | Status: DC | PRN

## 2016-06-07 MED ORDER — LEVALBUTEROL HCL 0.63 MG/3ML IN NEBU
0.6300 mg | INHALATION_SOLUTION | Freq: Four times a day (QID) | RESPIRATORY_TRACT | Status: DC | PRN
Start: 2016-06-07 — End: 2016-06-10

## 2016-06-07 MED ORDER — HYDROMORPHONE HCL 1 MG/ML IJ SOLN
0.2500 mg | INTRAMUSCULAR | Status: DC | PRN
  Administered 2016-06-07: 0.5 mg via INTRAVENOUS

## 2016-06-07 MED ORDER — DEXTROSE 5 % IV SOLN
1.5000 g | INTRAVENOUS | Status: AC
  Administered 2016-06-07: 1.5 g via INTRAVENOUS
  Filled 2016-06-07: qty 1.5

## 2016-06-07 MED ORDER — PHENYLEPHRINE 40 MCG/ML (10ML) SYRINGE FOR IV PUSH (FOR BLOOD PRESSURE SUPPORT)
PREFILLED_SYRINGE | INTRAVENOUS | Status: AC
  Filled 2016-06-07: qty 10

## 2016-06-07 MED ORDER — HYDROMORPHONE HCL 1 MG/ML IJ SOLN
INTRAMUSCULAR | Status: AC
  Filled 2016-06-07: qty 0.5

## 2016-06-07 MED ORDER — LIDOCAINE 2% (20 MG/ML) 5 ML SYRINGE
INTRAMUSCULAR | Status: DC | PRN
  Administered 2016-06-07: 30 mg via INTRAVENOUS

## 2016-06-07 MED ORDER — ONDANSETRON HCL 4 MG/2ML IJ SOLN: 4.0000 mg | Freq: Four times a day (QID) | INTRAMUSCULAR | Status: DC | PRN

## 2016-06-07 MED ORDER — HEMOSTATIC AGENTS (NO CHARGE) OPTIME
TOPICAL | Status: DC | PRN
  Administered 2016-06-07 (×3): 1 via TOPICAL

## 2016-06-07 MED ORDER — MIDAZOLAM HCL 2 MG/2ML IJ SOLN: 0.5000 mg | Freq: Once | INTRAMUSCULAR | Status: DC | PRN

## 2016-06-07 MED ORDER — SODIUM CHLORIDE 0.9 % IV SOLN
500.0000 mg | Freq: Once | INTRAVENOUS | Status: DC
  Filled 2016-06-07: qty 500

## 2016-06-07 MED ORDER — LIDOCAINE 2% (20 MG/ML) 5 ML SYRINGE
INTRAMUSCULAR | Status: AC
  Filled 2016-06-07: qty 5

## 2016-06-07 MED ORDER — SODIUM CHLORIDE 0.9 % IR SOLN
Status: DC | PRN
  Administered 2016-06-07: 2000 mL

## 2016-06-07 MED ORDER — LEVALBUTEROL HCL 0.63 MG/3ML IN NEBU
0.6300 mg | INHALATION_SOLUTION | Freq: Four times a day (QID) | RESPIRATORY_TRACT | Status: DC
  Administered 2016-06-07 (×2): 0.63 mg via RESPIRATORY_TRACT
  Filled 2016-06-07 (×2): qty 3

## 2016-06-07 MED ORDER — ROCURONIUM BROMIDE 50 MG/5ML IV SOSY
PREFILLED_SYRINGE | INTRAVENOUS | Status: AC
  Filled 2016-06-07: qty 5

## 2016-06-07 MED ORDER — SODIUM CHLORIDE 0.9% FLUSH: 9.0000 mL | INTRAVENOUS | Status: DC | PRN

## 2016-06-07 MED ORDER — PROPOFOL 10 MG/ML IV BOLUS
INTRAVENOUS | Status: AC
  Filled 2016-06-07: qty 20

## 2016-06-07 MED ORDER — MIDAZOLAM HCL 5 MG/5ML IJ SOLN
INTRAMUSCULAR | Status: DC | PRN
  Administered 2016-06-07 (×2): 1 mg via INTRAVENOUS

## 2016-06-07 MED ORDER — METOCLOPRAMIDE HCL 5 MG/ML IJ SOLN
10.0000 mg | Freq: Four times a day (QID) | INTRAMUSCULAR | Status: DC
  Administered 2016-06-07 – 2016-06-08 (×3): 10 mg via INTRAVENOUS
  Filled 2016-06-07 (×3): qty 2

## 2016-06-07 MED ORDER — DIPHENHYDRAMINE HCL 12.5 MG/5ML PO ELIX: 12.5000 mg | ORAL_SOLUTION | Freq: Four times a day (QID) | ORAL | Status: DC | PRN

## 2016-06-07 MED ORDER — FENTANYL CITRATE (PF) 250 MCG/5ML IJ SOLN
INTRAMUSCULAR | Status: AC
  Filled 2016-06-07: qty 5

## 2016-06-07 MED ORDER — EPHEDRINE 5 MG/ML INJ
INTRAVENOUS | Status: AC
  Filled 2016-06-07: qty 10

## 2016-06-07 MED ORDER — DIPHENHYDRAMINE HCL 50 MG/ML IJ SOLN: 12.5000 mg | Freq: Four times a day (QID) | INTRAMUSCULAR | Status: DC | PRN

## 2016-06-07 MED ORDER — BISACODYL 5 MG PO TBEC
10.0000 mg | DELAYED_RELEASE_TABLET | Freq: Every day | ORAL | Status: DC
  Administered 2016-06-08: 10 mg via ORAL
  Filled 2016-06-07 (×4): qty 2

## 2016-06-07 MED ORDER — LACTATED RINGERS IV SOLN
INTRAVENOUS | Status: DC | PRN
  Administered 2016-06-07: 07:00:00 via INTRAVENOUS

## 2016-06-07 MED ORDER — PROPOFOL 10 MG/ML IV BOLUS
INTRAVENOUS | Status: DC | PRN
  Administered 2016-06-07: 20 mg via INTRAVENOUS
  Administered 2016-06-07: 30 mg via INTRAVENOUS
  Administered 2016-06-07: 200 mg via INTRAVENOUS

## 2016-06-07 MED ORDER — POTASSIUM CHLORIDE IN NACL 20-0.45 MEQ/L-% IV SOLN
INTRAVENOUS | Status: DC
  Administered 2016-06-07: 14:00:00 via INTRAVENOUS
  Filled 2016-06-07 (×2): qty 1000

## 2016-06-07 MED ORDER — SUGAMMADEX SODIUM 200 MG/2ML IV SOLN
INTRAVENOUS | Status: DC | PRN
  Administered 2016-06-07: 150 mg via INTRAVENOUS

## 2016-06-07 MED ORDER — GUAIFENESIN ER 600 MG PO TB12: 600.0000 mg | ORAL_TABLET | Freq: Two times a day (BID) | ORAL | Status: DC | PRN

## 2016-06-07 MED ORDER — DEXTROSE 5 % IV SOLN
1.5000 g | Freq: Two times a day (BID) | INTRAVENOUS | Status: AC
  Administered 2016-06-07 – 2016-06-08 (×2): 1.5 g via INTRAVENOUS
  Filled 2016-06-07 (×2): qty 1.5

## 2016-06-07 MED ORDER — FENTANYL CITRATE (PF) 100 MCG/2ML IJ SOLN
INTRAMUSCULAR | Status: DC | PRN
  Administered 2016-06-07 (×2): 50 ug via INTRAVENOUS
  Administered 2016-06-07: 25 ug via INTRAVENOUS
  Administered 2016-06-07: 50 ug via INTRAVENOUS
  Administered 2016-06-07: 25 ug via INTRAVENOUS
  Administered 2016-06-07: 50 ug via INTRAVENOUS
  Administered 2016-06-07: 150 ug via INTRAVENOUS
  Administered 2016-06-07: 50 ug via INTRAVENOUS

## 2016-06-07 MED ORDER — SENNOSIDES-DOCUSATE SODIUM 8.6-50 MG PO TABS
1.0000 | ORAL_TABLET | Freq: Every day | ORAL | Status: DC
  Administered 2016-06-08 – 2016-06-09 (×2): 1 via ORAL
  Filled 2016-06-07 (×2): qty 1

## 2016-06-07 MED ORDER — FENTANYL 40 MCG/ML IV SOLN
INTRAVENOUS | Status: DC
  Administered 2016-06-07: 1000 ug via INTRAVENOUS
  Administered 2016-06-07: 30 ug via INTRAVENOUS
  Administered 2016-06-07: 15 ug via INTRAVENOUS
  Administered 2016-06-08 (×2): 75 ug via INTRAVENOUS
  Administered 2016-06-08: 60 ug via INTRAVENOUS
  Administered 2016-06-08 (×2): 15 ug via INTRAVENOUS
  Administered 2016-06-08: 1 ug via INTRAVENOUS
  Administered 2016-06-09: 3 ug via INTRAVENOUS
  Administered 2016-06-09: 15 ug via INTRAVENOUS
  Administered 2016-06-09: 0 ug via INTRAVENOUS
  Filled 2016-06-07: qty 25

## 2016-06-07 MED ORDER — ROCURONIUM BROMIDE 10 MG/ML (PF) SYRINGE
PREFILLED_SYRINGE | INTRAVENOUS | Status: DC | PRN
  Administered 2016-06-07: 30 mg via INTRAVENOUS
  Administered 2016-06-07: 10 mg via INTRAVENOUS
  Administered 2016-06-07: 50 mg via INTRAVENOUS
  Administered 2016-06-07: 10 mg via INTRAVENOUS

## 2016-06-07 MED ORDER — KETOROLAC TROMETHAMINE 30 MG/ML IJ SOLN
INTRAMUSCULAR | Status: DC | PRN
  Administered 2016-06-07: 30 mg via INTRAVENOUS

## 2016-06-07 MED ORDER — KETOROLAC TROMETHAMINE 30 MG/ML IJ SOLN
INTRAMUSCULAR | Status: AC
  Filled 2016-06-07: qty 1

## 2016-06-07 MED ORDER — ONDANSETRON HCL 4 MG/2ML IJ SOLN
INTRAMUSCULAR | Status: AC
  Filled 2016-06-07: qty 2

## 2016-06-07 MED ORDER — NALOXONE HCL 0.4 MG/ML IJ SOLN
0.4000 mg | INTRAMUSCULAR | Status: DC | PRN
  Filled 2016-06-07: qty 1

## 2016-06-07 MED ORDER — TRAMADOL HCL 50 MG PO TABS: 50.0000 mg | ORAL_TABLET | Freq: Four times a day (QID) | ORAL | Status: DC | PRN

## 2016-06-07 MED ORDER — MEPERIDINE HCL 25 MG/ML IJ SOLN: 6.2500 mg | INTRAMUSCULAR | Status: DC | PRN

## 2016-06-07 MED ORDER — MIDAZOLAM HCL 2 MG/2ML IJ SOLN
INTRAMUSCULAR | Status: AC
  Filled 2016-06-07: qty 2

## 2016-06-07 SURGICAL SUPPLY — 73 items
ADH SKN CLS APL DERMABOND .7 (GAUZE/BANDAGES/DRESSINGS) ×2
APL SRG 22X2 LUM MLBL SLNT (VASCULAR PRODUCTS) ×4
APPLICATOR TIP EXT COSEAL (VASCULAR PRODUCTS) ×6 IMPLANT
BAG DECANTER FOR FLEXI CONT (MISCELLANEOUS) IMPLANT
BLADE SURG 11 STRL SS (BLADE) ×4 IMPLANT
CANISTER SUCTION 2500CC (MISCELLANEOUS) ×4 IMPLANT
CATH KIT ON Q 5IN SLV (PAIN MANAGEMENT) IMPLANT
CATH ROBINSON RED A/P 22FR (CATHETERS) ×3 IMPLANT
CATH THORACIC 28FR (CATHETERS) ×6 IMPLANT
CATH THORACIC 36FR (CATHETERS) IMPLANT
CATH THORACIC 36FR RT ANG (CATHETERS) IMPLANT
CLIP TI MEDIUM 24 (CLIP) ×3 IMPLANT
CONT SPEC 4OZ CLIKSEAL STRL BL (MISCELLANEOUS) ×11 IMPLANT
COVER SURGICAL LIGHT HANDLE (MISCELLANEOUS) ×8 IMPLANT
CUTTER ECHEON FLEX ENDO 45 340 (ENDOMECHANICALS) ×3 IMPLANT
DERMABOND ADVANCED (GAUZE/BANDAGES/DRESSINGS) ×2
DERMABOND ADVANCED .7 DNX12 (GAUZE/BANDAGES/DRESSINGS) ×1 IMPLANT
DRAPE LAPAROSCOPIC ABDOMINAL (DRAPES) ×4 IMPLANT
DRAPE WARM FLUID 44X44 (DRAPE) ×4 IMPLANT
ELECT REM PT RETURN 9FT ADLT (ELECTROSURGICAL) ×4
ELECTRODE REM PT RTRN 9FT ADLT (ELECTROSURGICAL) ×2 IMPLANT
GAUZE SPONGE 4X4 12PLY STRL (GAUZE/BANDAGES/DRESSINGS) ×4 IMPLANT
GAUZE XEROFORM 1X8 LF (GAUZE/BANDAGES/DRESSINGS) ×3 IMPLANT
GLOVE BIO SURGEON STRL SZ 6.5 (GLOVE) ×8 IMPLANT
GLOVE BIO SURGEON STRL SZ7.5 (GLOVE) ×8 IMPLANT
GLOVE BIO SURGEONS STRL SZ 6.5 (GLOVE) ×4
GOWN STRL REUS W/ TWL LRG LVL3 (GOWN DISPOSABLE) ×6 IMPLANT
GOWN STRL REUS W/TWL LRG LVL3 (GOWN DISPOSABLE) ×12
KIT BASIN OR (CUSTOM PROCEDURE TRAY) ×4 IMPLANT
KIT ROOM TURNOVER OR (KITS) ×4 IMPLANT
KIT SUCTION CATH 14FR (SUCTIONS) ×4 IMPLANT
NS IRRIG 1000ML POUR BTL (IV SOLUTION) ×8 IMPLANT
PACK CHEST (CUSTOM PROCEDURE TRAY) ×4 IMPLANT
PAD ARMBOARD 7.5X6 YLW CONV (MISCELLANEOUS) ×8 IMPLANT
RELOAD STAPLE 45 GOLD REG/THCK (STAPLE) IMPLANT
SEALANT SURG COSEAL 4ML (VASCULAR PRODUCTS) ×3 IMPLANT
SEALANT SURG COSEAL 8ML (VASCULAR PRODUCTS) ×6 IMPLANT
SOLUTION ANTI FOG 6CC (MISCELLANEOUS) ×4 IMPLANT
SPONGE GAUZE 4X4 12PLY STER LF (GAUZE/BANDAGES/DRESSINGS) ×3 IMPLANT
SPONGE LAP 18X18 X RAY DECT (DISPOSABLE) ×3 IMPLANT
SPONGE TONSIL 1 RF SGL (DISPOSABLE) ×10 IMPLANT
STAPLE RELOAD 45MM GOLD (STAPLE) ×36 IMPLANT
SUT CHROMIC 3 0 SH 27 (SUTURE) IMPLANT
SUT CHROMIC 4 0 SH 27 (SUTURE) ×6 IMPLANT
SUT ETHILON 3 0 FSL (SUTURE) ×3 IMPLANT
SUT ETHILON 3 0 PS 1 (SUTURE) IMPLANT
SUT PROLENE 3 0 SH DA (SUTURE) IMPLANT
SUT PROLENE 4 0 RB 1 (SUTURE)
SUT PROLENE 4-0 RB1 .5 CRCL 36 (SUTURE) IMPLANT
SUT SILK  1 MH (SUTURE) ×4
SUT SILK 1 MH (SUTURE) ×4 IMPLANT
SUT SILK 2 0SH CR/8 30 (SUTURE) IMPLANT
SUT SILK 3 0SH CR/8 30 (SUTURE) IMPLANT
SUT VIC AB 1 CTX 18 (SUTURE) ×3 IMPLANT
SUT VIC AB 2 TP1 27 (SUTURE) ×3 IMPLANT
SUT VIC AB 2-0 CT2 18 VCP726D (SUTURE) IMPLANT
SUT VIC AB 2-0 CTX 36 (SUTURE) IMPLANT
SUT VIC AB 2-0 UR6 27 (SUTURE) ×6 IMPLANT
SUT VIC AB 3-0 SH 18 (SUTURE) IMPLANT
SUT VIC AB 3-0 X1 27 (SUTURE) ×9 IMPLANT
SUT VICRYL 0 UR6 27IN ABS (SUTURE) ×6 IMPLANT
SUT VICRYL 2 TP 1 (SUTURE) IMPLANT
SWAB COLLECTION DEVICE MRSA (MISCELLANEOUS) IMPLANT
SYSTEM SAHARA CHEST DRAIN ATS (WOUND CARE) ×4 IMPLANT
TAPE CLOTH SURG 4X10 WHT LF (GAUZE/BANDAGES/DRESSINGS) ×3 IMPLANT
TIP APPLICATOR SPRAY EXTEND 16 (VASCULAR PRODUCTS) IMPLANT
TOWEL OR 17X24 6PK STRL BLUE (TOWEL DISPOSABLE) ×4 IMPLANT
TOWEL OR 17X26 10 PK STRL BLUE (TOWEL DISPOSABLE) ×8 IMPLANT
TRAP SPECIMEN MUCOUS 40CC (MISCELLANEOUS) IMPLANT
TRAY FOLEY CATH 16FRSI W/METER (SET/KITS/TRAYS/PACK) ×4 IMPLANT
TUBE ANAEROBIC SPECIMEN COL (MISCELLANEOUS) IMPLANT
WATER STERILE IRR 1000ML POUR (IV SOLUTION) ×8 IMPLANT
YANKAUER SUCT BULB TIP NO VENT (SUCTIONS) ×3 IMPLANT

## 2016-06-07 NOTE — Anesthesia Preprocedure Evaluation (Addendum)
Anesthesia Evaluation  Patient identified by MRN, date of birth, ID band Patient awake    Reviewed: Allergy & Precautions, NPO status , Patient's Chart, lab work & pertinent test results  History of Anesthesia Complications Negative for: history of anesthetic complications  Airway Mallampati: II  TM Distance: >3 FB Neck ROM: Full    Dental  (+) Dental Advisory Given, Chipped, Teeth Intact   Pulmonary Current Smoker,  Recurrent, persistent PTX    breath sounds clear to auscultation       Cardiovascular (-) anginanegative cardio ROS   Rhythm:Regular Rate:Normal     Neuro/Psych  Headaches,    GI/Hepatic negative GI ROS, (+)     substance abuse  marijuana use,   Endo/Other  negative endocrine ROS  Renal/GU negative Renal ROS     Musculoskeletal   Abdominal   Peds  Hematology negative hematology ROS (+)   Anesthesia Other Findings   Reproductive/Obstetrics                          Anesthesia Physical Anesthesia Plan  ASA: II  Anesthesia Plan: General   Post-op Pain Management:    Induction: Intravenous  Airway Management Planned: Double Lumen EBT  Additional Equipment: Arterial line  Intra-op Plan:   Post-operative Plan: Extubation in OR  Informed Consent: I have reviewed the patients History and Physical, chart, labs and discussed the procedure including the risks, benefits and alternatives for the proposed anesthesia with the patient or authorized representative who has indicated his/her understanding and acceptance.   Dental advisory given  Plan Discussed with: CRNA, Anesthesiologist and Surgeon  Anesthesia Plan Comments: (Plan routine monitors, A-line, GETA with DLT)       Anesthesia Quick Evaluation

## 2016-06-07 NOTE — Progress Notes (Signed)
Patient ID: Verl BangsSean Hazard, male   DOB: 18-May-1975, 41 y.o.   MRN: [id number] EVENING ROUNDS NOTE :     [address]       [place] [id number]             [phone]                 Day of Surgery Procedure(s) (LRB): VIDEO ASSISTED THORACOSCOPY (Right) STAPLING OF BLEBS (Right)  Total Length of Stay:  LOS: 6 days  BP 108/79   Pulse 70   Temp 99.1 F (37.3 C) (Oral)   Resp (!) 22   Wt 170 lb 6.7 oz (77.3 kg)   SpO2 96%   BMI 24.98 kg/m   .Intake/Output      02/19 0701 - 02/20 0700   P.O.    I.V. (mL/kg) 1506.7 (19.5)   IV Piggyback 0   Total Intake(mL/kg) 1506.7 (19.5)   Urine (mL/kg/hr) 405 (0.4)   Blood 200 (0.2)   Chest Tube 110 (0.1)   Total Output 715   Net +791.7         . 0.45 % NaCl with KCl 20 mEq / L 100 mL/hr at 06/07/16 1411     Lab Results  Component Value Date   WBC 7.9 06/06/2016   HGB 16.1 06/06/2016   HCT 46.5 06/06/2016   PLT 188 06/06/2016   GLUCOSE 62 (L) 06/06/2016   ALT 32 06/06/2016   AST 21 06/06/2016   NA 139 06/06/2016   K 3.9 06/06/2016   CL 106 06/06/2016   CREATININE 1.12 06/06/2016   BUN 9 06/06/2016   CO2 26 06/06/2016   INR 1.02 06/06/2016   Stable postop, walked around up several times already  Delight OvensEdward B Yidel Teuscher MD  Beeper [phone] Office [phone] 06/07/2016 8:45 PM

## 2016-06-07 NOTE — Anesthesia Procedure Notes (Addendum)
Procedure Name: Intubation Date/Time: 06/07/2016 7:43 AM Performed by: Garrison Columbus T Pre-anesthesia Checklist: Patient identified, Emergency Drugs available, Suction available and Patient being monitored Patient Re-evaluated:Patient Re-evaluated prior to inductionOxygen Delivery Method: Circle System Utilized Preoxygenation: Pre-oxygenation with 100% oxygen Intubation Type: IV induction Ventilation: Mask ventilation without difficulty Laryngoscope Size: Mac and 3 Grade View: Grade I Endobronchial tube: Left, EBT position confirmed by auscultation, EBT position confirmed by fiberoptic bronchoscope and Double lumen EBT and 37 Fr Number of attempts: 1 Airway Equipment and Method: Stylet and Oral airway Placement Confirmation: ETT inserted through vocal cords under direct vision,  positive ETCO2 and breath sounds checked- equal and bilateral Tube secured with: Tape Dental Injury: Teeth and Oropharynx as per pre-operative assessment  Comments: Intubation by Briscoe Burns

## 2016-06-07 NOTE — Progress Notes (Signed)
Ok to DC foley per Dr. Donata ClayVan Trigt.  Hermina BartersBOWMAN, Aleaya Latona M, RN

## 2016-06-07 NOTE — Transfer of Care (Signed)
Immediate Anesthesia Transfer of Care Note  Patient: Riley BangsSean Nieblas  Procedure(s) Performed: Procedure(s): VIDEO ASSISTED THORACOSCOPY (Right) STAPLING OF BLEBS (Right)  Patient Location: PACU  Anesthesia Type:General  Level of Consciousness: awake, alert  and oriented  Airway & Oxygen Therapy: Patient Spontanous Breathing and Patient connected to nasal cannula oxygen  Post-op Assessment: Report given to RN, Post -op Vital signs reviewed and stable and Patient moving all extremities X 4  Post vital signs: Reviewed and stable  Last Vitals:  Vitals:   06/07/16 0439 06/07/16 1119  BP: 124/83 (!) 125/97  Pulse: 61   Resp: 18 16  Temp: 36.3 C 36.4 C    Last Pain:  Vitals:   06/07/16 0439  TempSrc: (P) Oral  PainSc:          Complications: No apparent anesthesia complications

## 2016-06-07 NOTE — Progress Notes (Signed)
The patient was examined and preop studies reviewed. There has been no change from the prior exam and the patient is ready for surgery. Plan Right VATS on Riley Lewis for pneumothorax

## 2016-06-07 NOTE — Anesthesia Postprocedure Evaluation (Signed)
Anesthesia Post Note  Patient: Riley Lewis  Procedure(s) Performed: Procedure(s) (LRB): VIDEO ASSISTED THORACOSCOPY (Right) STAPLING OF BLEBS (Right)  Patient location during evaluation: PACU Anesthesia Type: General Level of consciousness: awake and alert, oriented and patient cooperative Pain management: pain level controlled Vital Signs Assessment: post-procedure vital signs reviewed and stable Respiratory status: spontaneous breathing, nonlabored ventilation, respiratory function stable and patient connected to nasal cannula oxygen Cardiovascular status: blood pressure returned to baseline and stable Postop Assessment: no signs of nausea or vomiting Anesthetic complications: no Comments: L radial A-line removed in PACU. RN reports improvement in hand color, radial pulse strong.       Last Vitals:  Vitals:   06/07/16 1159 06/07/16 1200  BP:    Pulse:  73  Resp: (!) 21 18  Temp:  36.5 C    Last Pain:  Vitals:   06/07/16 1200  TempSrc:   PainSc: 3                  Ahmiyah Coil,E. Deavin Forst

## 2016-06-07 NOTE — Brief Op Note (Signed)
06/01/2016 - 06/07/2016  10:58 AM  PATIENT:  Riley Lewis  41 y.o. male  PRE-OPERATIVE DIAGNOSIS:  AIR LEAK  POST-OPERATIVE DIAGNOSIS:  AIR LEAK  PROCEDURE:  Procedure(s) : VIDEO ASSISTED THORACOSCOPY/MINI THORACOTOMY -Stapling of Blebs x 4 -Mechanical Pleurodesis  SURGEON:  Surgeon(s) and Role:    * Kerin PernaPeter Van Trigt, MD - Primary  PHYSICIAN ASSISTANT: Lowella DandyErin Toryn Mcclinton PA-C  ANESTHESIA:   general  EBL:  Total I/O In: 1100 [I.V.:1100] Out: 460 [Urine:260; Blood:200]  BLOOD ADMINISTERED:none  DRAINS: 28 Straight Chest Tube   LOCAL MEDICATIONS USED:  NONE  SPECIMEN:  Source of Specimen:  Wedge Resection of LUL x 4  DISPOSITION OF SPECIMEN:  PATHOLOGY  COUNTS:  YES  TOURNIQUET:  * No tourniquets in log *  DICTATION: .Dragon Dictation  PLAN OF CARE: Admit to inpatient   PATIENT DISPOSITION:  PACU - hemodynamically stable.   Delay start of Pharmacological VTE agent (>24hrs) due to surgical blood loss or risk of bleeding: yes

## 2016-06-07 NOTE — Care Management Note (Signed)
Case Management Note  Patient Details  Name: Verl BangsSean Lipford MRN: [id number] Date of Birth: 1975/07/30  Subjective/Objective:  S/P VATS                  Action/Plan:   PTA independent from home with wife.  PT would like PCP information.  CM will continue to follow for discharge needs   Expected Discharge Date:                  Expected Discharge Plan:  Home/Self Care  In-House Referral:     Discharge planning Services  CM Consult  Post Acute Care Choice:    Choice offered to:     DME Arranged:    DME Agency:     HH Arranged:    HH Agency:     Status of Service:  In process, will continue to follow  If discussed at Long Length of Stay Meetings, dates discussed:    Additional Comments:  Cherylann ParrClaxton, Daisey Caloca S, RN 06/07/2016, 3:13 PM

## 2016-06-08 ENCOUNTER — Encounter (HOSPITAL_COMMUNITY): Payer: Self-pay | Admitting: Cardiothoracic Surgery

## 2016-06-08 ENCOUNTER — Inpatient Hospital Stay (HOSPITAL_COMMUNITY): Payer: No Typology Code available for payment source

## 2016-06-08 LAB — BLOOD GAS, ARTERIAL
ACID-BASE DEFICIT: 1.3 mmol/L (ref 0.0–2.0)
BICARBONATE: 22.4 mmol/L (ref 20.0–28.0)
DRAWN BY: 398661
FIO2: 21
O2 Saturation: 93.5 %
PH ART: 7.43 (ref 7.350–7.450)
Patient temperature: 98.6
pCO2 arterial: 34.3 mmHg (ref 32.0–48.0)
pO2, Arterial: 68 mmHg — ABNORMAL LOW (ref 83.0–108.0)

## 2016-06-08 LAB — BASIC METABOLIC PANEL
Anion gap: 10 (ref 5–15)
BUN: 11 mg/dL (ref 6–20)
CALCIUM: 8.8 mg/dL — AB (ref 8.9–10.3)
CO2: 22 mmol/L (ref 22–32)
Chloride: 102 mmol/L (ref 101–111)
Creatinine, Ser: 0.95 mg/dL (ref 0.61–1.24)
GFR calc Af Amer: >60 mL/min (ref >60)
GLUCOSE: 101 mg/dL — AB (ref 65–99)
POTASSIUM: 4.2 mmol/L (ref 3.5–5.1)
SODIUM: 134 mmol/L — AB (ref 135–145)

## 2016-06-08 LAB — CBC
HCT: 43.6 % (ref 39.0–52.0)
HEMOGLOBIN: 15.1 g/dL (ref 13.0–17.0)
MCH: 30.9 pg (ref 26.0–34.0)
MCHC: 34.6 g/dL (ref 30.0–36.0)
MCV: 89.3 fL (ref 78.0–100.0)
Platelets: 199 10*3/uL (ref 150–400)
RBC: 4.88 MIL/uL (ref 4.22–5.81)
RDW: 12.4 % (ref 11.5–15.5)
WBC: 10.3 10*3/uL (ref 4.0–10.5)

## 2016-06-08 MED ORDER — OXYCODONE-ACETAMINOPHEN 5-325 MG PO TABS
1.0000 | ORAL_TABLET | ORAL | Status: DC | PRN
  Administered 2016-06-08 – 2016-06-09 (×2): 1 via ORAL
  Filled 2016-06-08 (×2): qty 1

## 2016-06-08 NOTE — Progress Notes (Addendum)
      [address]       [place] [id number]             [phone]      1 Day Post-Op Procedure(s) (LRB): VIDEO ASSISTED THORACOSCOPY (Right) STAPLING OF BLEBS (Right)   Subjective:  [name] is doing okay.  He complains that he does have some pain, which responds well to pain medication.  Also didn't sleep well.  Objective: Vital signs in last 24 hours: Temp:  [97.6 F (36.4 C)-99.1 F (37.3 C)] 98.9 F (37.2 C) (02/20 0400) Pulse Rate:  [68-83] 76 (02/20 0700) Cardiac Rhythm: Normal sinus rhythm (02/20 0400) Resp:  [13-33] 22 (02/20 0700) BP: (106-136)/(66-97) 117/82 (02/20 0700) SpO2:  [92 %-100 %] 94 % (02/20 0700)  Intake/Output from previous day: 02/19 0701 - 02/20 0700 In: 3066.7 [P.O.:360; I.V.:2656.7; IV Piggyback:50] Out: 1555 [Urine:1105; Blood:200; Chest Tube:250]  General appearance: alert, cooperative and no distress Heart: regular rate and rhythm Lungs: clear to auscultation bilaterally Abdomen: soft, non-tender; bowel sounds normal; no masses,  no organomegaly Extremities: extremities normal, atraumatic, no cyanosis or edema Wound: clean and dry  Lab Results:  Recent Labs  06/06/16 1240 06/08/16 0427  WBC 7.9 10.3  HGB 16.1 15.1  HCT 46.5 43.6  PLT 188 199   BMET:  Recent Labs  06/06/16 1240 06/08/16 0427  NA 139 134*  K 3.9 4.2  CL 106 102  CO2 26 22  GLUCOSE 62* 101*  BUN 9 11  CREATININE 1.12 0.95  CALCIUM 9.2 8.8*    PT/INR:  Recent Labs  06/06/16 1240  LABPROT 13.4  INR 1.02   ABG    Component Value Date/Time   PHART 7.430 06/08/2016 0412   HCO3 22.4 06/08/2016 0412   ACIDBASEDEF 1.3 06/08/2016 0412   O2SAT 93.5 06/08/2016 0412   CBG (last 3)  No results for input(s): GLUCAP in the last 72 hours.  Assessment/Plan: S/P Procedure(s) (LRB): VIDEO ASSISTED THORACOSCOPY (Right) STAPLING OF BLEBS (Right)  1. Chest tube- no air leak, CXR with stable pneumothorax, 250 cc output since surgery....  Leave chest tube  2. Pulm- aggressive pulm toilet, no acute issue, on RA 3. CV- NSR, hemodynamically stable 4. Pain control- continue PCA, Toradol ordered however patient hasn't tried, add Percocet prn 5. Dispo- patient stable, will transfer to 2W circle, continue current care   LOS: 7 days    [name], [name] 06/08/2016  chest tube to water seal Move to 2 west today Cont PCA  patient examined and medical record reviewed,agree with above note. Kathlee Nationseter Van Trigt III 06/08/2016

## 2016-06-08 NOTE — Progress Notes (Signed)
Patient transferred to 2W22. Patient ambulated to his new room using wheelchair for stabilization. SCDs placed on his new bed. Cellphone + cord and ipad at his bedside.

## 2016-06-08 NOTE — Progress Notes (Signed)
Pt arrived to 2w from 4E. Telemetry box applied and CCMD notified. Pt oriented to room. Vitals obtained. Pt has right side chest tube to water seal and PCA pump. Pt denies needs at this time.   Berdine DanceLauren Moffitt BSN, RN

## 2016-06-08 NOTE — Op Note (Signed)
Riley Lewis, MARTI                 ACCOUNT NO.:  [id number]  MEDICAL RECORD NO.:  [id number]  LOCATION:                                 FACILITY:  PHYSICIAN:  Kerin Perna, M.D.  DATE OF BIRTH:  Jan 08, 1976  DATE OF PROCEDURE:  06/07/2016 DATE OF DISCHARGE:                              OPERATIVE REPORT   OPERATION: 1. Right VATS (video-assisted thoracoscopic surgery) with     resection/stapling of apical blebs. 2. Mechanical pleurodesis.  PREOPERATIVE DIAGNOSIS:  Spontaneous right pneumothorax with persistent air leak.  POSTOPERATIVE DIAGNOSIS:  Spontaneous right pneumothorax with persistent air leak.  SURGEON:  Kerin Perna, MD.  ASSISTANT:  Pauline Good, PAC.  ANESTHESIA:  General by Dr. Jairo Ben.  INDICATION:  The patient is a 41 year old Caucasian male with history of smoking and first onset of a large 80% right spontaneous pneumothorax. He was treated with a chest tube placement, which re-expanded the lung. However, he had persistent air leak for several days.  CT scan of the chest demonstrated significant number of apical blebs on the right side and a few on the left side.  VATS for stapling of blebs and pleurodesis was recommended and forwarded to the patient, who agreed.  I discussed the procedure with the patient before surgery including the use of general anesthesia, location of the surgical incisions, and the expected postoperative recovery.  He understood the risks of bleeding, persistent air leak, and infection.  OPERATIVE PROCEDURE:  The patient was brought to the operating room and placed supine on the operating table.  General anesthesia was induced. The patient was intubated with a double-lumen endotracheal tube and then positioned right side up.  The right chest was prepped and draped as a sterile field.  A proper time-out was performed.  Small incisions for the VATS procedure were placed circumferentially around the right chest. First  incision was made at the tip of the scapula in seventh interspace. The second incision was made in the anterior axillary line at the fifth interspace.  A third incision was made in the fifth interspace posterior to the scapular border.  The camera was inserted.  The apex of the lung was stuck with adhesions to the chest and pleura.  These were taken down with electrocautery using the VATS instruments and electrocautery.  The lung was completely mobilized and carefully inspected.  Several blebs were noticed in the apex of the right lung.  Using Endo-GIA stapling device and several loads, approximately 5 wedge resections were performed to remove these blebs.  The staple lines were reinforced with CoSeal.  After the initial resections were performed, the patient was tested and found to have a persistent air leak and 2 more bleb resections were performed.  It appeared that at least one of these bleb resections was a ruptured bleb.  Hemostasis was achieved and the chest was irrigated with saline.  A 28- French chest tube was placed through a separate incision and directed to the apex.  The lung was then re-expanded under direct vision.  The VATS incisions were closed in layers using Vicryl.  Sterile dressings were applied.  The chest tube was connected to a  water-seal Pleur-evac drainage system.  The patient was rolled supine, extubated and returned to the recovery room.  A chest x-ray in the operating room demonstrated the chest tube to be in good position without pneumothorax.     Kerin PernaPeter Van Trigt, M.D.   ______________________________ Kerin PernaPeter Van Trigt, M.D.    PV/MEDQ  D:  06/07/2016  T:  06/07/2016  Job:  [id number]

## 2016-06-09 ENCOUNTER — Inpatient Hospital Stay (HOSPITAL_COMMUNITY): Payer: No Typology Code available for payment source

## 2016-06-09 LAB — COMPREHENSIVE METABOLIC PANEL
ALBUMIN: 3.1 g/dL — AB (ref 3.5–5.0)
ALK PHOS: 38 U/L (ref 38–126)
ALT: 21 U/L (ref 17–63)
ANION GAP: 8 (ref 5–15)
AST: 22 U/L (ref 15–41)
BILIRUBIN TOTAL: 0.7 mg/dL (ref 0.3–1.2)
BUN: 7 mg/dL (ref 6–20)
CALCIUM: 8.5 mg/dL — AB (ref 8.9–10.3)
CO2: 26 mmol/L (ref 22–32)
Chloride: 102 mmol/L (ref 101–111)
Creatinine, Ser: 0.99 mg/dL (ref 0.61–1.24)
GLUCOSE: 90 mg/dL (ref 65–99)
Potassium: 4.2 mmol/L (ref 3.5–5.1)
Sodium: 136 mmol/L (ref 135–145)
TOTAL PROTEIN: 5.7 g/dL — AB (ref 6.5–8.1)

## 2016-06-09 LAB — CBC
HEMATOCRIT: 41.5 % (ref 39.0–52.0)
Hemoglobin: 13.9 g/dL (ref 13.0–17.0)
MCH: 30.5 pg (ref 26.0–34.0)
MCHC: 33.5 g/dL (ref 30.0–36.0)
MCV: 91 fL (ref 78.0–100.0)
Platelets: 161 10*3/uL (ref 150–400)
RBC: 4.56 MIL/uL (ref 4.22–5.81)
RDW: 12.6 % (ref 11.5–15.5)
WBC: 7.1 10*3/uL (ref 4.0–10.5)

## 2016-06-09 NOTE — Progress Notes (Signed)
PCA discontinued. 13mL from syringe of fentanyl wasted in sink. Katrina, RN witnessed waste and documented in pyxis.   Berdine DanceLauren Moffitt BSN, RN

## 2016-06-09 NOTE — Progress Notes (Addendum)
      [address]       [place] [id number]             [phone]      2 Days Post-Op Procedure(s) (LRB): VIDEO ASSISTED THORACOSCOPY (Right) STAPLING OF BLEBS (Right)   Subjective:  No new complaints.  States Toradol really helped his pain.  + ambulation  No BM  Objective: Vital signs in last 24 hours: Temp:  [97.5 F (36.4 C)-98.9 F (37.2 C)] 98.9 F (37.2 C) (02/21 0527) Pulse Rate:  [62-76] 71 (02/21 0527) Cardiac Rhythm: Normal sinus rhythm (02/20 1900) Resp:  [18-28] 18 (02/21 0527) BP: (94-116)/(46-79) 116/79 (02/21 0527) SpO2:  [94 %-100 %] 97 % (02/21 0527) FiO2 (%):  [0 %] 0 % (02/21 0357)  Intake/Output from previous day: 02/20 0701 - 02/21 0700 In: 543.5 [P.O.:360; I.V.:133.5; IV Piggyback:50] Out: 145 [Chest Tube:145]  General appearance: alert, cooperative and no distress Heart: regular rate and rhythm Lungs: clear to auscultation bilaterally Abdomen: soft, non-tender; bowel sounds normal; no masses,  no organomegaly Extremities: extremities normal, atraumatic, no cyanosis or edema Wound: clean, some serous drainage around chest tube  Lab Results:  Recent Labs  06/08/16 0427 06/09/16 0453  WBC 10.3 7.1  HGB 15.1 13.9  HCT 43.6 41.5  PLT 199 161   BMET:  Recent Labs  06/08/16 0427 06/09/16 0453  NA 134* 136  K 4.2 4.2  CL 102 102  CO2 22 26  GLUCOSE 101* 90  BUN 11 7  CREATININE 0.95 0.99  CALCIUM 8.8* 8.5*    PT/INR:  Recent Labs  06/06/16 1240  LABPROT 13.4  INR 1.02   ABG    Component Value Date/Time   PHART 7.430 06/08/2016 0412   HCO3 22.4 06/08/2016 0412   ACIDBASEDEF 1.3 06/08/2016 0412   O2SAT 93.5 06/08/2016 0412   CBG (last 3)  No results for input(s): GLUCAP in the last 72 hours.  Assessment/Plan: S/P Procedure(s) (LRB): VIDEO ASSISTED THORACOSCOPY (Right) STAPLING OF BLEBS (Right)  1. Chest tube- no air leak present, drainage is minimal- leave on water seal today.. As CXR showed small  increase in pneumothorax 2. Pulm- no acute issues, continue aggressive IS 3. CV- NSR, remains hemodynamically stable 4. Pain control- continue Toradol, PCA, Percocet 5. Dispo- patient stable, small increase in pneumothorax on CXR, no air leak present.... Leave chest tube to water seal today, repeat CXR in AM   LOS: 8 days    [name], [name] 06/09/2016  Patient examined and chest x-ray performed today personally reviewed Patient has no air leak from the chest tube Surgical incisions are clean and dry Chest x-ray shows a trace right upper pneumothorax with chest tube in good position We'll leave on water seal today, chest x-ray in a.m., if stable then remove chest tube tomorrow a.m. Check chest x-ray in 6 hours and then discharge patient if stable

## 2016-06-10 ENCOUNTER — Inpatient Hospital Stay (HOSPITAL_COMMUNITY): Payer: No Typology Code available for payment source

## 2016-06-10 MED ORDER — GUAIFENESIN ER 600 MG PO TB12: 600.0000 mg | ORAL_TABLET | Freq: Two times a day (BID) | ORAL | Status: DC | PRN

## 2016-06-10 MED ORDER — OXYCODONE-ACETAMINOPHEN 5-325 MG PO TABS: 1.0000 | ORAL_TABLET | ORAL | 0 refills | Status: DC | PRN

## 2016-06-10 MED ORDER — ACETAMINOPHEN 500 MG PO TABS: 1000.0000 mg | ORAL_TABLET | Freq: Four times a day (QID) | ORAL | 0 refills | Status: DC | PRN

## 2016-06-10 NOTE — Progress Notes (Signed)
      [address]       [place] [id number]             [phone]      3 Days Post-Op Procedure(s) (LRB): VIDEO ASSISTED THORACOSCOPY (Right) STAPLING OF BLEBS (Right)   Subjective:  Mr. Lorna DibbleSewell has no complaints.  States he is doing well.  Objective: Vital signs in last 24 hours: Temp:  [97.8 F (36.6 C)-98.2 F (36.8 C)] 97.8 F (36.6 C) (02/22 0459) Pulse Rate:  [59-75] 59 (02/22 0459) Cardiac Rhythm: Normal sinus rhythm (02/21 1900) Resp:  [18-20] 18 (02/22 0459) BP: (113-119)/(66-82) 119/81 (02/22 0459) SpO2:  [98 %-100 %] 98 % (02/22 0459)  Intake/Output from previous day: 02/21 0701 - 02/22 0700 In: 960 [P.O.:960] Out: 65 [Chest Tube:65]  General appearance: alert, cooperative and no distress Heart: regular rate and rhythm Lungs: clear to auscultation bilaterally Abdomen: soft, non-tender; bowel sounds normal; no masses,  no organomegaly Extremities: extremities normal, atraumatic, no cyanosis or edema Wound: clean and dry  Lab Results:  Recent Labs  06/08/16 0427 06/09/16 0453  WBC 10.3 7.1  HGB 15.1 13.9  HCT 43.6 41.5  PLT 199 161   BMET:  Recent Labs  06/08/16 0427 06/09/16 0453  NA 134* 136  K 4.2 4.2  CL 102 102  CO2 22 26  GLUCOSE 101* 90  BUN 11 7  CREATININE 0.95 0.99  CALCIUM 8.8* 8.5*    PT/INR: No results for input(s): LABPROT, INR in the last 72 hours. ABG    Component Value Date/Time   PHART 7.430 06/08/2016 0412   HCO3 22.4 06/08/2016 0412   ACIDBASEDEF 1.3 06/08/2016 0412   O2SAT 93.5 06/08/2016 0412   CBG (last 3)  No results for input(s): GLUCAP in the last 72 hours.  Assessment/Plan: S/P Procedure(s) (LRB): VIDEO ASSISTED THORACOSCOPY (Right) STAPLING OF BLEBS (Right)  1. Chest tube- no air leak, CXR remains stable- will d/c chest tube today 2. CV- remains hemodynamically stable 3. Dispo- patient stable, d/c chest tube today.. Repeat CXR at 2pm if okay with d/c home today   LOS: 9 days     Raford PitcherBARRETT, Denny PeonRIN 06/10/2016

## 2016-06-10 NOTE — Progress Notes (Signed)
Right chest tube removed per order. Pt tolerated well. Site clean and dry. Stitch reinforced. Vaseline and gauze dressing applied. Chest xray ordered for this afternoon.  Berdine DanceLauren Moffitt BSN, RN

## 2016-06-10 NOTE — Progress Notes (Signed)
Pt has been discharged home with wife. IV and telemetry box removed. Pt received discharge instructions and all questions were answered. Pt left with all of his belongings. Pt ambulated off the unit after discharge, per pt request. Pt was accompanied by pt's family.   Berdine DanceLauren Moffitt BSN, RN

## 2016-06-10 NOTE — Discharge Instructions (Signed)
Pneumothorax °Introduction °A pneumothorax, commonly called a collapsed lung, is a condition in which air leaks from a lung and builds up in the space between the lung and the chest wall (pleural space). The air in a pneumothorax is trapped outside the lung and takes up space, preventing the lung from fully expanding. This is a condition that usually occurs suddenly. The buildup of air may be small or large. A small pneumothorax may go away on its own. When a pneumothorax is larger, it will often require medical treatment and hospitalization. °What are the causes? °A pneumothorax can sometimes happen quickly with no apparent cause. People with underlying lung problems, particularly COPD or emphysema, are at higher risk of pneumothorax. However, pneumothorax can happen quickly even in people with no prior known lung problems. Trauma, surgery, medical procedures, or injury to the chest wall can also cause a pneumothorax. °What are the signs or symptoms? °Sometimes a pneumothorax will have no symptoms. When symptoms are present, they can include: °· Chest pain. °· Shortness of breath. °· Increased rate of breathing. °· Bluish color to your lips or skin (cyanosis). °How is this diagnosed? °Pneumothorax is usually diagnosed by a chest X-ray or chest CT scan. Your health care provider will also take a medical history and perform a physical exam to determine why you may have a pneumothorax. °How is this treated? °A small pneumothorax may go away on its own without treatment. Extra oxygen can sometimes help a small pneumothorax go away more quickly. For a larger pneumothorax or a pneumothorax that is causing symptoms, a procedure is usually needed to drain the air. In some cases, the health care provider may drain the air using a needle. In other cases, a chest tube may be inserted into the pleural space. A chest tube is a small tube placed between the ribs and into the pleural space. This removes the extra air and allows  the lung to expand back to its normal size. A large pneumothorax will usually require a hospital stay. If there is ongoing air leakage into the pleural space, then the chest tube may need to remain in place for several days until the air leak has healed. In some cases, surgery may be needed. °Follow these instructions at home: °· Only take over-the-counter or prescription medicines as directed by your health care provider. °· If a cough or pain makes it difficult for you to sleep at night, try sleeping in a semi-upright position in a recliner or by using 2 or 3 pillows. °· Rest and limit activity as directed by your health care provider. °· If you had a chest tube and it was removed, ask your health care provider when it is okay to remove the dressing. Until your health care provider says you can remove the dressing, do not allow it to get wet. °· Do not smoke. Smoking is a risk factor for pneumothorax. °· Do not fly in an airplane or scuba dive until your health care provider says it is okay. °· Follow up with your health care provider as directed. °Get help right away if: °· You have increasing chest pain or shortness of breath. °· You have a cough that is not controlled with suppressants. °· You begin coughing up blood. °· You have pain that is getting worse or is not controlled with medicines. °· You cough up thick, discolored mucus (sputum) that is yellow to green in color. °· You have redness, increasing pain, or discharge at the site where a   chest tube had been in place (if your pneumothorax was treated with a chest tube).  The site where your chest tube was located opens up.  You feel air coming out of the site where the chest tube was placed.  You have a fever or persistent symptoms for more than 2-3 days.  You have a fever and your symptoms suddenly get worse. This information is not intended to replace advice given to you by your health care provider. Make sure you discuss any questions you have  with your health care provider. Document Released: 04/05/2005 Document Revised: 09/11/2015 Document Reviewed: 08/29/2013  2017 Elsevier  Video-Assisted Thoracic Surgery, Care After Refer to this sheet in the next few weeks. These instructions provide you with information on caring for yourself after your procedure. Your caregiver may also give you more specific instructions. Your procedure has been planned according to current medical practices, but problems sometimes occur. Call your caregiver if you have any problems or questions after your procedure. HOME CARE INSTRUCTIONS   Only take over-the-counter or prescription medications as directed.  Only take pain medications (narcotics) as directed.  Do not drive until your caregiver approves. Driving while taking narcotics or soon after surgery can be dangerous, so discuss the specific timing with your caregiver.  Avoid activities that use your chest muscles, such as lifting heavy objects, for at least 3-4 weeks.   Take deep breaths to expand the lungs and to protect against pneumonia.  Do breathing exercises as directed by your caregiver. If you were given an incentive spirometer to help with breathing, use it as directed.  You may resume a normal diet and activities when you feel you are able to or as directed.  Do not take a bath until your caregiver says it is OK. Use the shower instead.   Keep the bandage (dressing) covering the area where the chest tube was inserted (incision site) dry for 48 hours. After 48 hours, remove the dressing unless there is new drainage.  Remove dressings as directed by your caregiver.  Change dressings if necessary or as directed.  Keep all follow-up appointments. It is important for you to see your caregiver after surgery to discuss appropriate follow-up care and surveillance, if it is necessary. SEEK MEDICAL CARE:  You feel excessive or increasing pain at an incision site.  You notice bleeding,  skin irritation, drainage, swelling, or redness at an incision site.  There is a bad smell coming from an incision or dressing.  It feels like your heart is fluttering or beating rapidly.  Your pain medication does not relieve your pain. SEEK IMMEDIATE MEDICAL CARE IF:   You have a fever.   You have chest pain.  You have a rash.  You have shortness of breath.  You have trouble breathing.   You feel weak, lightheaded, dizzy, or faint.  MAKE SURE YOU:   Understand these instructions.   Will watch your condition.   Will get help right away if you are not doing well or get worse. This information is not intended to replace advice given to you by your health care provider. Make sure you discuss any questions you have with your health care provider. Document Released: 07/31/2012 Document Revised: 04/26/2014 Document Reviewed: 07/31/2012 Elsevier Interactive Patient Education  2017 [employer]

## 2016-06-10 NOTE — Care Management Note (Signed)
Case Management Note Previous CM note initiated by Cherylann Parrlaxton, Samantha S, RN 06/07/2016, 3:13 PM   Patient Details  Name: Riley Lewis MRN: [id number] Date of Birth: 1975/06/05  Subjective/Objective:  S/P VATS                  Action/Plan:   PTA independent from home with wife.  PT would like PCP information.  CM will continue to follow for discharge needs   Expected Discharge Date:  06/10/16               Expected Discharge Plan:  Home/Self Care  In-House Referral:     Discharge planning Services  CM Consult  Post Acute Care Choice:  NA Choice offered to:  NA  DME Arranged:    DME Agency:     HH Arranged:    HH Agency:     Status of Service:  Completed, signed off  If discussed at Long Length of Stay Meetings, dates discussed:  2/22  Discharge Disposition: home/self care   Additional Comments:  06/10/16- 1445- Donn PieriniKristi Mallory Enriques RN, CM- pt for d/c home today- have provided pt [facility] # for PCP needs on AVS- no other CM needs noted for discharge.   Zenda AlpersWebster, HavreKristi Hall, RN 06/10/2016, 2:51 PM [phone]

## 2016-06-14 ENCOUNTER — Ambulatory Visit: Payer: PRIVATE HEALTH INSURANCE

## 2016-06-17 ENCOUNTER — Other Ambulatory Visit: Payer: Self-pay | Admitting: Cardiothoracic Surgery

## 2016-06-17 DIAGNOSIS — J939 Pneumothorax, unspecified: Secondary | ICD-10-CM

## 2016-06-21 ENCOUNTER — Ambulatory Visit
Admission: RE | Admit: 2016-06-21 | Discharge: 2016-06-21 | Disposition: A | Discharge status: Home / Self Care | Payer: No Typology Code available for payment source | Source: Ambulatory Visit | Attending: Cardiothoracic Surgery | Admitting: Cardiothoracic Surgery

## 2016-06-21 ENCOUNTER — Ambulatory Visit (INDEPENDENT_AMBULATORY_CARE_PROVIDER_SITE_OTHER): Payer: Self-pay | Admitting: Physician Assistant

## 2016-06-21 VITALS — BP 145/100 | HR 90 | Resp 20 | Ht 69.25 in | Wt 174.0 lb

## 2016-06-21 DIAGNOSIS — J939 Pneumothorax, unspecified: Secondary | ICD-10-CM

## 2016-06-21 NOTE — Patient Instructions (Signed)

## 2016-06-21 NOTE — Progress Notes (Addendum)
  HPI:  Patient returns for routine postoperative follow-up having undergone a right chest tube placement for spontaneous pneumothorax on 06/07/2016. He then underwent a right VATS, stapling, mechanical pleurodesis on 06/07/2016 by Dr. Donata ClayVan Trigt. Since hospital discharge the patient reports, he has some pain anterior to incision. He denies shortness of breath, however.  Current Outpatient Prescriptions  Medication Sig Dispense Refill  . acetaminophen (TYLENOL) 500 MG tablet Take 2 tablets (1,000 mg total) by mouth every 6 (six) hours as needed for mild pain. 30 tablet 0  . guaiFENesin (MUCINEX) 600 MG 12 hr tablet Take 1 tablet (600 mg total) by mouth 2 (two) times daily as needed for cough.    Vital Signs:  BP 145/100, HR 90, RR 20, Oxygen saturation 98% on room air.   Physical Exam: CV-RRR Pulmonary-Slightly diminished right base and clear on the left Wounds-Clean and dry. No sign of infection. One silk suture and several nylon sutures removed.  Diagnostic Tests: CLINICAL DATA:  41 year old male status post surgical stapling of blebs in the right lung, VIDEO ASSISTED THORACOSCOPY on 06/07/2016. Subsequent encounter.  EXAM: CHEST  2 VIEW  COMPARISON:  06/10/2016 and earlier.  FINDINGS: New small right lung base hydropneumothorax since 06/10/2016, involving the entire right lung base but associated air-fluid level is most apparent in the posterior costophrenic angle on the lateral view. On the lateral view lobulated anterior lung opacity might represent fluid loculation.  No apical or non dependent pleural fluid identified. Staple line visible in the right upper lung. Architectural distortion about the right hilum appears stable. Other mediastinal contours remain within normal limits. Visualized tracheal air column is within normal limits. Negative left lung except for suspected bulla in the left lateral costophrenic angle.  No acute osseous abnormality identified. Negative  visible bowel gas pattern.  IMPRESSION: Small loculated right hydropneumothorax at the base of the lungs since 06/10/2016.  Electronically Signed: By: Odessa FlemingH  Hall M.D. On: 06/21/2016 13:06  Impression and Plan: Overall, Mr. Lorna DibbleSewell is recovering well from right VATS, stapling, and mechanical pleurodesis. He does have a new, small right hydropneumothorax on today's chest x ray. He was informed no strenuous activity and no flying until instructed otherwise. He was instructed if he has shortness of breath and or worsening pain, to go to ER or Urgent care as soon as possible. He will return to see Dr. Donata ClayVan Trigt in 1-2 weeks with a chest x ray.    Ardelle BallsZIMMERMAN,DONIELLE M, PA-C [facility] [phone]

## 2016-06-30 ENCOUNTER — Other Ambulatory Visit: Payer: Self-pay | Admitting: Cardiothoracic Surgery

## 2016-06-30 ENCOUNTER — Encounter: Payer: Self-pay | Admitting: Cardiothoracic Surgery

## 2016-06-30 ENCOUNTER — Ambulatory Visit (INDEPENDENT_AMBULATORY_CARE_PROVIDER_SITE_OTHER): Payer: Self-pay | Admitting: Cardiothoracic Surgery

## 2016-06-30 ENCOUNTER — Ambulatory Visit
Admission: RE | Admit: 2016-06-30 | Discharge: 2016-06-30 | Disposition: A | Discharge status: Home / Self Care | Payer: No Typology Code available for payment source | Source: Ambulatory Visit | Attending: Cardiothoracic Surgery | Admitting: Cardiothoracic Surgery

## 2016-06-30 VITALS — BP 120/80 | HR 66 | Resp 20 | Ht 69.25 in | Wt 174.0 lb

## 2016-06-30 DIAGNOSIS — J939 Pneumothorax, unspecified: Secondary | ICD-10-CM

## 2016-06-30 NOTE — Progress Notes (Signed)
PCP is No PCP Per Patient Referring Provider is Lorre NickAllen, Anthony, MD  Chief Complaint  Patient presents with  . Routine Post Op    1 week f/u with CXR s/p R VATS, STAPLING OF BLEBS 06/07/16    RUE:[id number]:Scheduled postoperative visit after right VATS stapling of blebs and mechanical pleurodesis for pneumothorax  He has no symptoms of shortness of breath. Surgical incisions are healing well and he admits to minimal postop pain.  One month ago chest x-ray at offices showed a small right subpleural hydropneumothorax. On today's exam this has significantly improved and almost resolved. He is cleared to return to work in normal daily activities around the house and yard but no heavy exercise or lifting at a fitness center or gym  Past Medical History:  Diagnosis Date  . Hemorrhoids   . Migraine    "stopped at age 41" (06/01/2016)  . Seasonal allergies   . Spontaneous pneumothorax 06/01/2016   right    Past Surgical History:  Procedure Laterality Date  . CHEST TUBE INSERTION Right 06/01/2016  . FRACTURE SURGERY    . STAPLING OF BLEBS Right 06/07/2016   Procedure: STAPLING OF BLEBS;  Surgeon: Kerin PernaPeter Van Trigt, MD;  [facility];  Service: Thoracic;  Laterality: Right;  Marland Kitchen. VIDEO ASSISTED THORACOSCOPY Right 06/07/2016   Procedure: VIDEO ASSISTED THORACOSCOPY;  Surgeon: Kerin PernaPeter Van Trigt, MD;  [facility];  Service: Thoracic;  Laterality: Right;  . WRIST FRACTURE SURGERY Left ~ 2001    Family History  Problem Relation Age of Onset  . Hyperlipidemia Mother   . Hypertension Mother   . Hyperlipidemia Father   . Hypertension Father   . Breast cancer Maternal Grandmother   . Throat cancer Maternal Grandfather   . Lung cancer Paternal Grandfather     Social History Social History  Substance Use Topics  . Smoking status: Current Every Day Smoker    Years: 21.00    Types: Cigarettes, E-cigarettes  . Smokeless tobacco: Former NeurosurgeonUser    Types: Chew    Quit date: 1994     Comment: 06/01/2016  "quit smoking cigarettes 3 years ago; been vaping since"  . Alcohol use 14.4 oz/week    12 Standard drinks or equivalent, 12 Cans of beer per week    No current outpatient prescriptions on file.   No current facility-administered medications for this visit.     Allergies  Allergen Reactions  . Oxycodone Itching    Review of Systems  No cough shortness breath or fever  BP 120/80   Pulse 66   Resp 20   Ht 5' 9.25" (1.759 m)   Wt 174 lb (78.9 kg)   SpO2 98%   BMI 25.51 kg/m  Physical Exam      Exam    General- alert and comfortable   Lungs- clear without rales, wheezes. VATS incisions well-healed   Cor- regular rate and rhythm, no murmur , gallop   Abdomen- soft, non-tender   Extremities - warm, non-tender, minimal edema   Neuro- oriented, appropriate, no focal weakness   Diagnostic Tests: Chest x-ray with significant improvement in right subpleural pneumothorax.  Impression: Continue progression after surgery  Plan: Patient will return for chest x-ray in 8 weeks. He will avoid heavy strenuous activity until that time. Importance of total smoking cessation emphasized the patient. Mikey BussingPeter Van Trigt III, MD [facility] [phone]

## 2016-08-24 ENCOUNTER — Other Ambulatory Visit: Payer: Self-pay | Admitting: Cardiothoracic Surgery

## 2016-08-24 DIAGNOSIS — J939 Pneumothorax, unspecified: Secondary | ICD-10-CM

## 2016-09-01 ENCOUNTER — Ambulatory Visit (INDEPENDENT_AMBULATORY_CARE_PROVIDER_SITE_OTHER): Payer: Self-pay | Admitting: Cardiothoracic Surgery

## 2016-09-01 ENCOUNTER — Ambulatory Visit
Admission: RE | Admit: 2016-09-01 | Discharge: 2016-09-01 | Disposition: A | Discharge status: Home / Self Care | Payer: No Typology Code available for payment source | Source: Ambulatory Visit | Attending: Cardiothoracic Surgery | Admitting: Cardiothoracic Surgery

## 2016-09-01 ENCOUNTER — Encounter: Payer: Self-pay | Admitting: Cardiothoracic Surgery

## 2016-09-01 VITALS — BP 152/107 | HR 77 | Resp 16 | Ht 69.25 in | Wt 174.0 lb

## 2016-09-01 DIAGNOSIS — J439 Emphysema, unspecified: Secondary | ICD-10-CM

## 2016-09-01 DIAGNOSIS — J939 Pneumothorax, unspecified: Secondary | ICD-10-CM

## 2016-09-01 DIAGNOSIS — Z09 Encounter for follow-up examination after completed treatment for conditions other than malignant neoplasm: Secondary | ICD-10-CM

## 2016-09-01 NOTE — Progress Notes (Signed)
PCP is Patient, No Pcp Per Referring Provider is Lorre NickAllen, Anthony, MD  Chief Complaint  Patient presents with  . Routine Post Op    2 month f/u with a CXR    HPI: Final postop visit after right VATS for resection of blebs after patient presented with spontaneous pneumothorax and persistent air leak from chest tube therapy. 6 weeks ago the patient returned and had a small right basilar hydropneumothorax. A chest x-ray today demonstrates the lung has completely reexpanded and there is no residual hydropneumothorax. The patient is asymptomatic. The incisions are well-healed. He denies postthoracotomy pain symptoms.   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 He knows to report any symptoms of left-sided pain associated with shortness of breath as he is at risk for left-sided spontaneous pneumothorax.  The patient has completely stopped smoking. Past Medical History:  Diagnosis Date  . Hemorrhoids   . Migraine    "stopped at age 41" (06/01/2016)  . Seasonal allergies   . Spontaneous pneumothorax 06/01/2016   right    Past Surgical History:  Procedure Laterality Date  . CHEST TUBE INSERTION Right 06/01/2016  . FRACTURE SURGERY    . STAPLING OF BLEBS Right 06/07/2016   Procedure: STAPLING OF BLEBS;  Surgeon: Kerin PernaPeter Van Trigt, MD;  [facility];  Service: Thoracic;  Laterality: Right;  Marland Kitchen. VIDEO ASSISTED THORACOSCOPY Right 06/07/2016   Procedure: VIDEO ASSISTED THORACOSCOPY;  Surgeon: Kerin PernaPeter Van Trigt, MD;  [facility];  Service: Thoracic;  Laterality: Right;  . WRIST FRACTURE SURGERY Left ~ 2001    Family History  Problem Relation Age of Onset  . Hyperlipidemia Mother   . Hypertension Mother   . Hyperlipidemia Father   . Hypertension Father   . Breast cancer Maternal Grandmother   . Throat cancer Maternal Grandfather   . Lung cancer Paternal Grandfather     Social History Social History   Substance Use Topics  . Smoking status: Current Every Day Smoker    Years: 21.00    Types: Cigarettes, E-cigarettes  . Smokeless tobacco: Former NeurosurgeonUser    Types: Chew    Quit date: 1994     Comment: 06/01/2016 "quit smoking cigarettes 3 years ago; been vaping since"  . Alcohol use 14.4 oz/week    12 Standard drinks or equivalent, 12 Cans of beer per week    No current outpatient prescriptions on file.   No current facility-administered medications for this visit.     Allergies  Allergen Reactions  . Oxycodone Itching    Review of Systems  No chest pain No shortness of breath No fever No weight loss  BP (!) 152/107   Pulse 77   Resp 16   Ht 5' 9.25" (1.759 m)   Wt 174 lb (78.9 kg)   SpO2 98% Comment: ON RA  BMI 25.51 kg/m  Physical Exam      Exam    General- alert and comfortable   Lungs- clear without rales, wheezes   Cor- regular rate and rhythm, no murmur , gallop   Abdomen- soft, non-tender   Extremities - warm, non-tender, minimal edema   Neuro- oriented, appropriate, no focal weakness   Diagnostic Tests:  Chest x-ray clear Impression: Complete recovery after right VATS for resection of blebs for spontaneous pneumothorax  Plan: Return as needed. Report left-sided symptoms of pneumothorax if they occur.   Kathlee NationsPeter Van Trigt  III, MD [facility] [phone]

## 2018-12-19 IMAGING — CR DG CHEST 2V
2 series · 2 of 2 positions shown · non-contrast
Comparison: 06/10/2016 and earlier.

ADDENDUM:
Study discussed by telephone with with Nurse Chai Tiger in the
office of Dr. JASMINEE SCHULMAN on 06/21/2016 at 7177 hours.
CLINICAL DATA: 40-year-old male status post surgical stapling of
blebs in the right lung, VIDEO ASSISTED THORACOSCOPY on 06/07/2016.
Subsequent encounter.

EXAM:
CHEST  2 VIEW

[w chest pa]
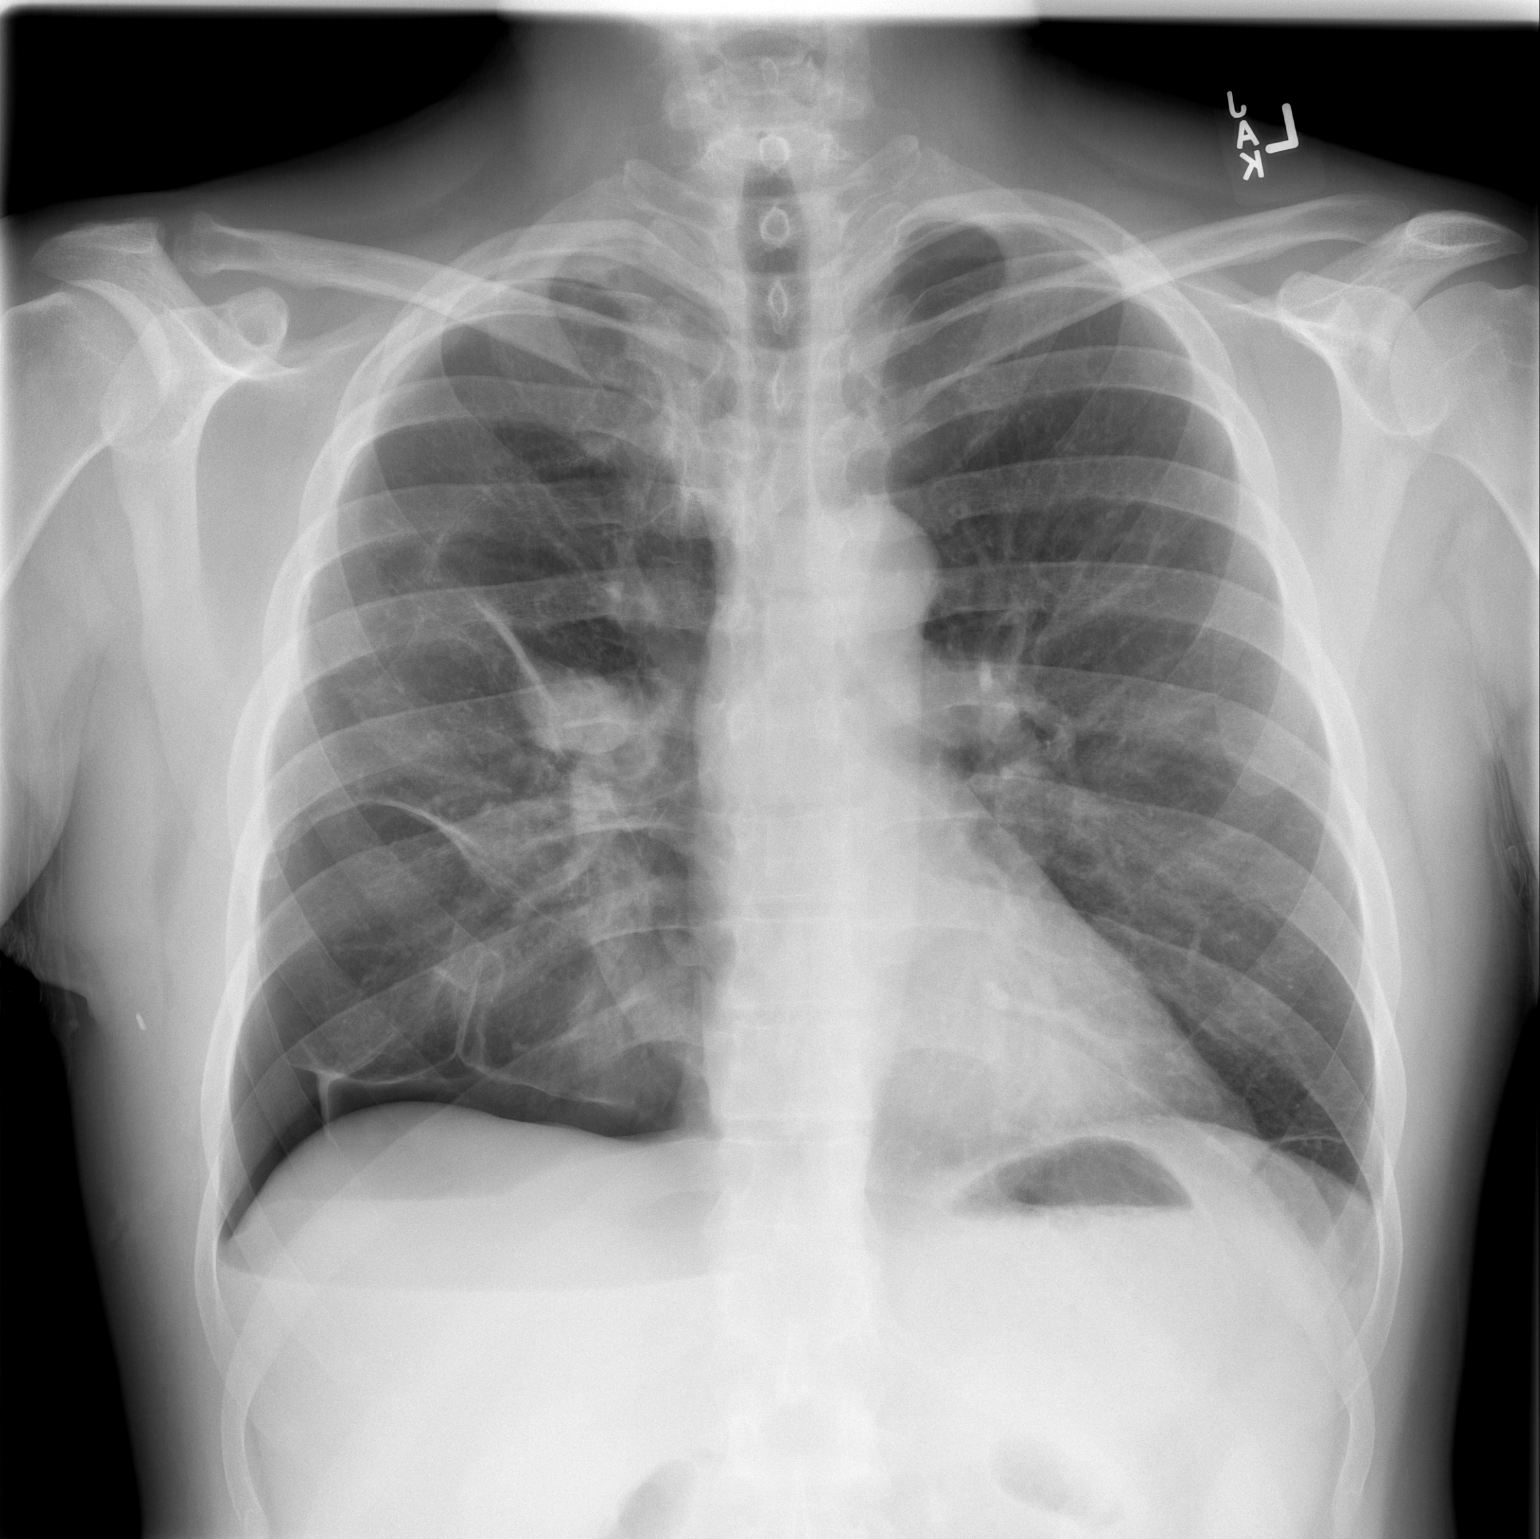

[w chest lat]
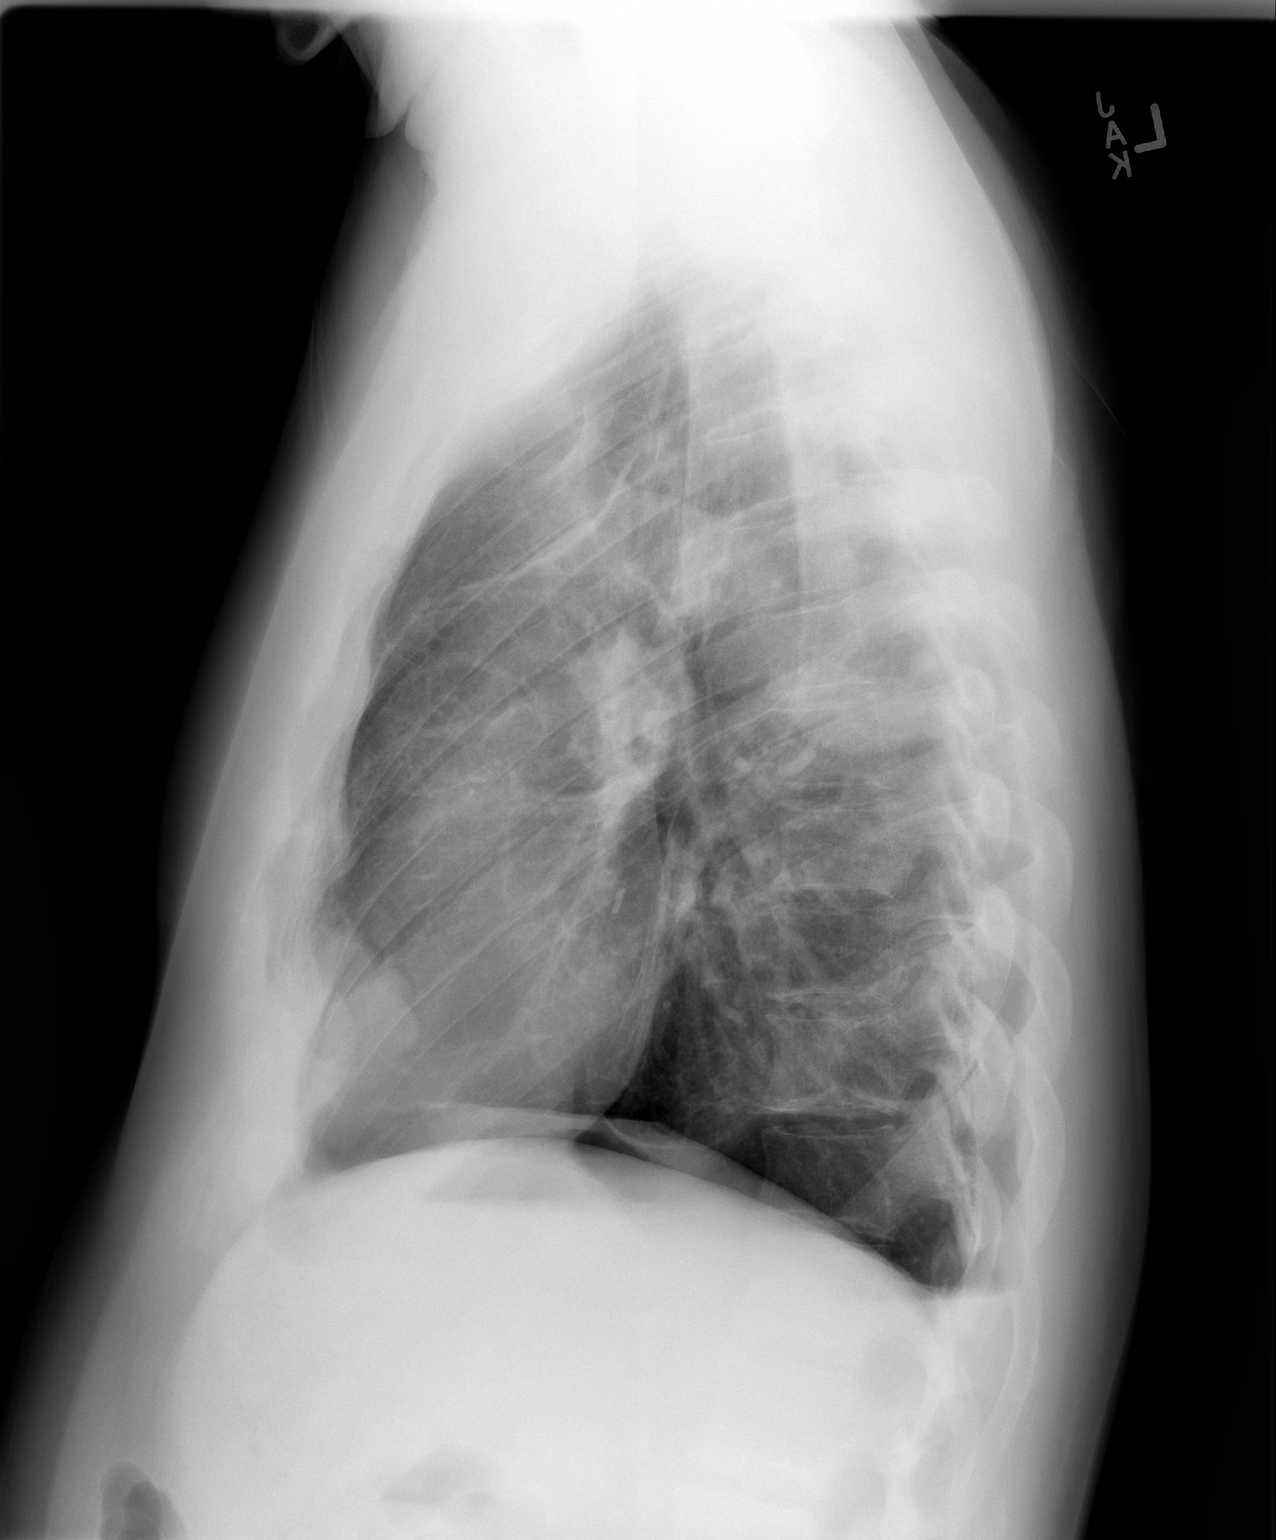

[2 of 2 positions shown; findings below may reference images not displayed]

FINDINGS: New small right lung base hydropneumothorax since 06/10/2016,
involving the entire right lung base but associated air-fluid level
is most apparent in the posterior costophrenic angle on the lateral
view. On the lateral view lobulated anterior lung opacity might
represent fluid loculation.

No apical or non dependent pleural fluid identified. Staple line
visible in the right upper lung. Architectural distortion about the
right hilum appears stable. Other mediastinal contours remain within
normal limits. Visualized tracheal air column is within normal
limits. Negative left lung except for suspected bulla in the left
lateral costophrenic angle.

No acute osseous abnormality identified. Negative visible bowel gas
pattern.
IMPRESSION: Small loculated right hydropneumothorax at the base of the lungs
since 06/10/2016.

## 2019-01-25 ENCOUNTER — Encounter: Payer: Self-pay | Admitting: Orthopaedic Surgery

## 2019-01-25 ENCOUNTER — Ambulatory Visit (INDEPENDENT_AMBULATORY_CARE_PROVIDER_SITE_OTHER): Payer: Self-pay

## 2019-01-25 ENCOUNTER — Ambulatory Visit (INDEPENDENT_AMBULATORY_CARE_PROVIDER_SITE_OTHER): Payer: Self-pay | Admitting: Orthopaedic Surgery

## 2019-01-25 DIAGNOSIS — M25531 Pain in right wrist: Secondary | ICD-10-CM | POA: Diagnosis not present

## 2019-01-25 MED ORDER — LIDOCAINE HCL 1 % IJ SOLN
0.3000 mL | INTRAMUSCULAR | Status: AC | PRN
  Administered 2019-01-25: 09:00:00 .3 mL

## 2019-01-25 MED ORDER — BUPIVACAINE HCL 0.25 % IJ SOLN
0.3300 mL | INTRAMUSCULAR | Status: AC | PRN
  Administered 2019-01-25: 09:00:00 .33 mL

## 2019-01-25 MED ORDER — METHYLPREDNISOLONE ACETATE 40 MG/ML IJ SUSP
13.3300 mg | INTRAMUSCULAR | Status: AC | PRN
  Administered 2019-01-25: 09:00:00 13.33 mg

## 2019-01-25 NOTE — Progress Notes (Signed)
Office Visit Note   Patient: Riley Lewis           Date of Birth: October 13, 1975           MRN: [id number] Visit Date: 01/25/2019              Requested by: No referring provider defined for this encounter. PCP: Patient, No Pcp Per   Assessment & Plan: Visit Diagnoses:  1. Pain in right wrist     Plan: Impression is right wrist arthritis.  We injected this with cortisone today.  I have recommended relative rest for the next few weeks.  He will follow-up with [place] as needed.  Call with concerns or questions in the meantime.  Follow-Up Instructions: Return if symptoms worsen or fail to improve.   Orders:  Orders Placed This Encounter  Procedures  . Hand/UE Inj  . XR Wrist Complete Right   No orders of the defined types were placed in this encounter.     Procedures: Hand/UE Inj for osteoarthritis on 01/25/2019 8:42 AM Medications: 0.33 mL bupivacaine 0.25 %; 0.3 mL lidocaine 1 %; 13.33 mg methylPREDNISolone acetate 40 MG/ML      Clinical Data: No additional findings.   Subjective: Chief Complaint  Patient presents with  . Right Wrist - Pain    HPI patient is a pleasant 43 year old right-hand-dominant gentleman who presents our clinic today with wrist pain.  This began approximately 3 weeks ago without any specific injury.  He noticed this while playing golf.  The pain he has is to the dorsum of the wrist.  He has associated soreness and stiffness.  Pain is worse with wrist extension as when he is playing golf.  He has tried ice, heat and rest without significant relief of symptoms.  He denies any numbness, tingling or burning.  He does note a history of tendinitis for which his wrist was injected several years back which did seem to help.  Review of Systems as detailed in HPI.  All others reviewed and are negative.   Objective: Vital Signs: There were no vitals taken for this visit.  Physical Exam well-developed and well-nourished gentleman in no acute distress.  Alert  and oriented x3.  Ortho Exam examination of his right wrist reveals mild to moderate tenderness to the dorsal aspect.  No tenderness to the first second third or fourth dorsal compartments.  He does have mild swelling to the area.  Increased pain with wrist extension and radial deviation.  Negative Finkelstein.  He is neurovascularly intact distally.  Specialty Comments:  No specialty comments available.  Imaging: Xr Wrist Complete Right  Result Date: 01/25/2019 Mild diffuse degenerative changes    PMFS History: Patient Active Problem List   Diagnosis Date Noted  . Pneumothorax on right 06/01/2016   Past Medical History:  Diagnosis Date  . Hemorrhoids   . Migraine    "stopped at age 58" (06/01/2016)  . Seasonal allergies   . Spontaneous pneumothorax 06/01/2016   right    Family History  Problem Relation Age of Onset  . Hyperlipidemia Mother   . Hypertension Mother   . Hyperlipidemia Father   . Hypertension Father   . Breast cancer Maternal Grandmother   . Throat cancer Maternal Grandfather   . Lung cancer Paternal Grandfather     Past Surgical History:  Procedure Laterality Date  . CHEST TUBE INSERTION Right 06/01/2016  . FRACTURE SURGERY    . STAPLING OF BLEBS Right 06/07/2016   Procedure: STAPLING OF  BLEBS;  Surgeon: Kerin Perna, MD;  Location: [facility];  Service: Thoracic;  Laterality: Right;  Marland Kitchen VIDEO ASSISTED THORACOSCOPY Right 06/07/2016   Procedure: VIDEO ASSISTED THORACOSCOPY;  Surgeon: Kerin Perna, MD;  [facility];  Service: Thoracic;  Laterality: Right;  . WRIST FRACTURE SURGERY Left ~ 2001   Social History   Occupational History  . Occupation: [employer]  Tobacco Use  . Smoking status: Current Every Day Smoker    Years: 21.00    Types: Cigarettes, E-cigarettes  . Smokeless tobacco: Former Neurosurgeon    Types: Chew    Quit date: 60  . Tobacco comment: 06/01/2016 "quit smoking cigarettes 3 years ago; been vaping since"  Substance and Sexual  Activity  . Alcohol use: Yes    Alcohol/week: 24.0 standard drinks    Types: 12 Standard drinks or equivalent, 12 Cans of beer per week  . Drug use: Yes    Types: Marijuana    Comment: 06/01/2016 "smoked weed in college"  . Sexual activity: Yes
